# Patient Record
Sex: Female | Born: 1965 | Race: White | Hispanic: No | Marital: Married | Smoking: Never smoker
Health system: Southern US, Community
[De-identification: ages and names within clinical notes are randomized; demographics above are authoritative.]

## PROBLEM LIST (undated history)

## (undated) DIAGNOSIS — M1990 Unspecified osteoarthritis, unspecified site: Secondary | ICD-10-CM

## (undated) DIAGNOSIS — E119 Type 2 diabetes mellitus without complications: Secondary | ICD-10-CM

## (undated) DIAGNOSIS — I10 Essential (primary) hypertension: Secondary | ICD-10-CM

## (undated) DIAGNOSIS — E785 Hyperlipidemia, unspecified: Secondary | ICD-10-CM

## (undated) DIAGNOSIS — F419 Anxiety disorder, unspecified: Secondary | ICD-10-CM

## (undated) DIAGNOSIS — Z5189 Encounter for other specified aftercare: Secondary | ICD-10-CM

## (undated) HISTORY — DX: Type 2 diabetes mellitus without complications: E11.9

## (undated) HISTORY — DX: Encounter for other specified aftercare: Z51.89

## (undated) HISTORY — PX: OTHER SURGICAL HISTORY: SHX169

## (undated) HISTORY — DX: Anxiety disorder, unspecified: F41.9

## (undated) HISTORY — DX: Unspecified osteoarthritis, unspecified site: M19.90

## (undated) HISTORY — DX: Essential (primary) hypertension: I10

## (undated) HISTORY — DX: Hyperlipidemia, unspecified: E78.5

---

## 2013-07-21 HISTORY — PX: COLONOSCOPY: SHX174

## 2014-03-24 DIAGNOSIS — R112 Nausea with vomiting, unspecified: Secondary | ICD-10-CM | POA: Diagnosis not present

## 2014-03-24 DIAGNOSIS — K760 Fatty (change of) liver, not elsewhere classified: Secondary | ICD-10-CM | POA: Diagnosis not present

## 2014-03-24 DIAGNOSIS — N979 Female infertility, unspecified: Secondary | ICD-10-CM | POA: Diagnosis not present

## 2014-03-24 DIAGNOSIS — D251 Intramural leiomyoma of uterus: Secondary | ICD-10-CM | POA: Diagnosis not present

## 2014-03-24 DIAGNOSIS — K828 Other specified diseases of gallbladder: Secondary | ICD-10-CM | POA: Diagnosis not present

## 2014-03-24 DIAGNOSIS — R1011 Right upper quadrant pain: Secondary | ICD-10-CM | POA: Diagnosis not present

## 2014-03-24 DIAGNOSIS — R197 Diarrhea, unspecified: Secondary | ICD-10-CM | POA: Diagnosis not present

## 2014-04-23 DIAGNOSIS — S9032XA Contusion of left foot, initial encounter: Secondary | ICD-10-CM | POA: Diagnosis not present

## 2014-04-23 DIAGNOSIS — E780 Pure hypercholesterolemia: Secondary | ICD-10-CM | POA: Diagnosis not present

## 2014-04-23 DIAGNOSIS — S99922A Unspecified injury of left foot, initial encounter: Secondary | ICD-10-CM | POA: Diagnosis not present

## 2014-04-23 DIAGNOSIS — M79672 Pain in left foot: Secondary | ICD-10-CM | POA: Diagnosis not present

## 2014-10-04 DIAGNOSIS — B9689 Other specified bacterial agents as the cause of diseases classified elsewhere: Secondary | ICD-10-CM | POA: Diagnosis not present

## 2014-10-04 DIAGNOSIS — J208 Acute bronchitis due to other specified organisms: Secondary | ICD-10-CM | POA: Diagnosis not present

## 2014-10-04 DIAGNOSIS — I10 Essential (primary) hypertension: Secondary | ICD-10-CM | POA: Diagnosis not present

## 2014-11-16 DIAGNOSIS — M25552 Pain in left hip: Secondary | ICD-10-CM | POA: Diagnosis not present

## 2014-11-16 DIAGNOSIS — M79642 Pain in left hand: Secondary | ICD-10-CM | POA: Diagnosis not present

## 2014-12-12 DIAGNOSIS — M545 Low back pain: Secondary | ICD-10-CM | POA: Diagnosis not present

## 2014-12-12 DIAGNOSIS — M5416 Radiculopathy, lumbar region: Secondary | ICD-10-CM | POA: Diagnosis not present

## 2014-12-19 DIAGNOSIS — R51 Headache: Secondary | ICD-10-CM | POA: Diagnosis not present

## 2014-12-19 DIAGNOSIS — R109 Unspecified abdominal pain: Secondary | ICD-10-CM | POA: Diagnosis not present

## 2014-12-19 DIAGNOSIS — M25512 Pain in left shoulder: Secondary | ICD-10-CM | POA: Diagnosis not present

## 2014-12-19 DIAGNOSIS — M546 Pain in thoracic spine: Secondary | ICD-10-CM | POA: Diagnosis not present

## 2014-12-19 DIAGNOSIS — S46912A Strain of unspecified muscle, fascia and tendon at shoulder and upper arm level, left arm, initial encounter: Secondary | ICD-10-CM | POA: Diagnosis not present

## 2014-12-19 DIAGNOSIS — M542 Cervicalgia: Secondary | ICD-10-CM | POA: Diagnosis not present

## 2014-12-19 DIAGNOSIS — S301XXA Contusion of abdominal wall, initial encounter: Secondary | ICD-10-CM | POA: Diagnosis not present

## 2014-12-19 DIAGNOSIS — M545 Low back pain: Secondary | ICD-10-CM | POA: Diagnosis not present

## 2014-12-19 DIAGNOSIS — S161XXA Strain of muscle, fascia and tendon at neck level, initial encounter: Secondary | ICD-10-CM | POA: Diagnosis not present

## 2014-12-19 DIAGNOSIS — R0789 Other chest pain: Secondary | ICD-10-CM | POA: Diagnosis not present

## 2014-12-19 DIAGNOSIS — S7002XA Contusion of left hip, initial encounter: Secondary | ICD-10-CM | POA: Diagnosis not present

## 2014-12-19 DIAGNOSIS — R079 Chest pain, unspecified: Secondary | ICD-10-CM | POA: Diagnosis not present

## 2014-12-20 DIAGNOSIS — M62830 Muscle spasm of back: Secondary | ICD-10-CM | POA: Diagnosis not present

## 2014-12-20 DIAGNOSIS — M25519 Pain in unspecified shoulder: Secondary | ICD-10-CM | POA: Diagnosis not present

## 2014-12-20 DIAGNOSIS — S3992XA Unspecified injury of lower back, initial encounter: Secondary | ICD-10-CM | POA: Diagnosis not present

## 2014-12-29 DIAGNOSIS — J0100 Acute maxillary sinusitis, unspecified: Secondary | ICD-10-CM | POA: Diagnosis not present

## 2015-01-19 DIAGNOSIS — R202 Paresthesia of skin: Secondary | ICD-10-CM | POA: Diagnosis not present

## 2015-01-19 DIAGNOSIS — M542 Cervicalgia: Secondary | ICD-10-CM | POA: Diagnosis not present

## 2015-01-27 DIAGNOSIS — Z1231 Encounter for screening mammogram for malignant neoplasm of breast: Secondary | ICD-10-CM | POA: Diagnosis not present

## 2015-03-16 DIAGNOSIS — Z1389 Encounter for screening for other disorder: Secondary | ICD-10-CM | POA: Diagnosis not present

## 2015-03-16 DIAGNOSIS — Z8776 Personal history of (corrected) congenital malformations of integument, limbs and musculoskeletal system: Secondary | ICD-10-CM | POA: Diagnosis not present

## 2015-03-16 DIAGNOSIS — M542 Cervicalgia: Secondary | ICD-10-CM | POA: Diagnosis not present

## 2015-03-16 DIAGNOSIS — G43909 Migraine, unspecified, not intractable, without status migrainosus: Secondary | ICD-10-CM | POA: Diagnosis not present

## 2015-03-16 DIAGNOSIS — G8929 Other chronic pain: Secondary | ICD-10-CM | POA: Diagnosis not present

## 2015-05-04 DIAGNOSIS — R509 Fever, unspecified: Secondary | ICD-10-CM | POA: Diagnosis not present

## 2015-05-04 DIAGNOSIS — J209 Acute bronchitis, unspecified: Secondary | ICD-10-CM | POA: Diagnosis not present

## 2015-05-04 DIAGNOSIS — R05 Cough: Secondary | ICD-10-CM | POA: Diagnosis not present

## 2015-06-05 DIAGNOSIS — R42 Dizziness and giddiness: Secondary | ICD-10-CM | POA: Diagnosis not present

## 2015-06-05 DIAGNOSIS — N938 Other specified abnormal uterine and vaginal bleeding: Secondary | ICD-10-CM | POA: Diagnosis not present

## 2015-06-06 DIAGNOSIS — N912 Amenorrhea, unspecified: Secondary | ICD-10-CM | POA: Diagnosis not present

## 2015-06-06 DIAGNOSIS — Z1389 Encounter for screening for other disorder: Secondary | ICD-10-CM | POA: Diagnosis not present

## 2015-06-06 DIAGNOSIS — R42 Dizziness and giddiness: Secondary | ICD-10-CM | POA: Diagnosis not present

## 2015-07-03 DIAGNOSIS — M79629 Pain in unspecified upper arm: Secondary | ICD-10-CM | POA: Diagnosis not present

## 2015-07-03 DIAGNOSIS — J209 Acute bronchitis, unspecified: Secondary | ICD-10-CM | POA: Diagnosis not present

## 2015-07-11 DIAGNOSIS — M5412 Radiculopathy, cervical region: Secondary | ICD-10-CM | POA: Diagnosis not present

## 2015-07-11 DIAGNOSIS — M47812 Spondylosis without myelopathy or radiculopathy, cervical region: Secondary | ICD-10-CM | POA: Diagnosis not present

## 2015-07-11 DIAGNOSIS — Z79891 Long term (current) use of opiate analgesic: Secondary | ICD-10-CM | POA: Diagnosis not present

## 2015-07-11 DIAGNOSIS — G894 Chronic pain syndrome: Secondary | ICD-10-CM | POA: Diagnosis not present

## 2015-07-11 DIAGNOSIS — S143XXS Injury of brachial plexus, sequela: Secondary | ICD-10-CM | POA: Diagnosis not present

## 2015-07-18 DIAGNOSIS — M542 Cervicalgia: Secondary | ICD-10-CM | POA: Diagnosis not present

## 2015-07-18 DIAGNOSIS — S199XXA Unspecified injury of neck, initial encounter: Secondary | ICD-10-CM | POA: Diagnosis not present

## 2015-07-18 DIAGNOSIS — M5021 Other cervical disc displacement,  high cervical region: Secondary | ICD-10-CM | POA: Diagnosis not present

## 2015-08-08 DIAGNOSIS — M5412 Radiculopathy, cervical region: Secondary | ICD-10-CM | POA: Diagnosis not present

## 2015-08-08 DIAGNOSIS — S143XXS Injury of brachial plexus, sequela: Secondary | ICD-10-CM | POA: Diagnosis not present

## 2015-08-08 DIAGNOSIS — G5601 Carpal tunnel syndrome, right upper limb: Secondary | ICD-10-CM | POA: Diagnosis not present

## 2015-08-09 DIAGNOSIS — Q731 Phocomelia, unspecified limb(s): Secondary | ICD-10-CM | POA: Diagnosis not present

## 2015-08-09 DIAGNOSIS — G894 Chronic pain syndrome: Secondary | ICD-10-CM | POA: Diagnosis not present

## 2015-08-09 DIAGNOSIS — M47812 Spondylosis without myelopathy or radiculopathy, cervical region: Secondary | ICD-10-CM | POA: Diagnosis not present

## 2015-08-09 DIAGNOSIS — M5412 Radiculopathy, cervical region: Secondary | ICD-10-CM | POA: Diagnosis not present

## 2015-09-22 DIAGNOSIS — R1011 Right upper quadrant pain: Secondary | ICD-10-CM | POA: Diagnosis not present

## 2015-09-22 DIAGNOSIS — K219 Gastro-esophageal reflux disease without esophagitis: Secondary | ICD-10-CM | POA: Diagnosis not present

## 2015-09-22 DIAGNOSIS — N3000 Acute cystitis without hematuria: Secondary | ICD-10-CM | POA: Diagnosis not present

## 2015-09-28 DIAGNOSIS — R1011 Right upper quadrant pain: Secondary | ICD-10-CM | POA: Diagnosis not present

## 2015-10-09 DIAGNOSIS — G894 Chronic pain syndrome: Secondary | ICD-10-CM | POA: Diagnosis not present

## 2015-10-09 DIAGNOSIS — Q731 Phocomelia, unspecified limb(s): Secondary | ICD-10-CM | POA: Diagnosis not present

## 2015-10-09 DIAGNOSIS — M5412 Radiculopathy, cervical region: Secondary | ICD-10-CM | POA: Diagnosis not present

## 2015-10-09 DIAGNOSIS — M47812 Spondylosis without myelopathy or radiculopathy, cervical region: Secondary | ICD-10-CM | POA: Diagnosis not present

## 2015-11-06 DIAGNOSIS — G894 Chronic pain syndrome: Secondary | ICD-10-CM | POA: Diagnosis not present

## 2015-11-06 DIAGNOSIS — Q731 Phocomelia, unspecified limb(s): Secondary | ICD-10-CM | POA: Diagnosis not present

## 2015-11-06 DIAGNOSIS — M47812 Spondylosis without myelopathy or radiculopathy, cervical region: Secondary | ICD-10-CM | POA: Diagnosis not present

## 2015-11-06 DIAGNOSIS — M5412 Radiculopathy, cervical region: Secondary | ICD-10-CM | POA: Diagnosis not present

## 2015-11-22 DIAGNOSIS — S93402A Sprain of unspecified ligament of left ankle, initial encounter: Secondary | ICD-10-CM | POA: Diagnosis not present

## 2015-11-22 DIAGNOSIS — Q72893 Other reduction defects of lower limb, bilateral: Secondary | ICD-10-CM | POA: Diagnosis not present

## 2015-11-22 DIAGNOSIS — Z23 Encounter for immunization: Secondary | ICD-10-CM | POA: Diagnosis not present

## 2015-11-22 DIAGNOSIS — Z8776 Personal history of (corrected) congenital malformations of integument, limbs and musculoskeletal system: Secondary | ICD-10-CM | POA: Diagnosis not present

## 2015-12-06 DIAGNOSIS — G894 Chronic pain syndrome: Secondary | ICD-10-CM | POA: Diagnosis not present

## 2015-12-06 DIAGNOSIS — Q731 Phocomelia, unspecified limb(s): Secondary | ICD-10-CM | POA: Diagnosis not present

## 2015-12-06 DIAGNOSIS — M47812 Spondylosis without myelopathy or radiculopathy, cervical region: Secondary | ICD-10-CM | POA: Diagnosis not present

## 2015-12-06 DIAGNOSIS — M5412 Radiculopathy, cervical region: Secondary | ICD-10-CM | POA: Diagnosis not present

## 2016-01-03 DIAGNOSIS — Z79891 Long term (current) use of opiate analgesic: Secondary | ICD-10-CM | POA: Diagnosis not present

## 2016-01-03 DIAGNOSIS — Q731 Phocomelia, unspecified limb(s): Secondary | ICD-10-CM | POA: Diagnosis not present

## 2016-01-03 DIAGNOSIS — M47812 Spondylosis without myelopathy or radiculopathy, cervical region: Secondary | ICD-10-CM | POA: Diagnosis not present

## 2016-01-03 DIAGNOSIS — G4700 Insomnia, unspecified: Secondary | ICD-10-CM | POA: Diagnosis not present

## 2016-01-03 DIAGNOSIS — G894 Chronic pain syndrome: Secondary | ICD-10-CM | POA: Diagnosis not present

## 2016-01-03 DIAGNOSIS — M5412 Radiculopathy, cervical region: Secondary | ICD-10-CM | POA: Diagnosis not present

## 2016-01-31 DIAGNOSIS — M5412 Radiculopathy, cervical region: Secondary | ICD-10-CM | POA: Diagnosis not present

## 2016-01-31 DIAGNOSIS — M47812 Spondylosis without myelopathy or radiculopathy, cervical region: Secondary | ICD-10-CM | POA: Diagnosis not present

## 2016-01-31 DIAGNOSIS — Q731 Phocomelia, unspecified limb(s): Secondary | ICD-10-CM | POA: Diagnosis not present

## 2016-01-31 DIAGNOSIS — G894 Chronic pain syndrome: Secondary | ICD-10-CM | POA: Diagnosis not present

## 2016-02-26 DIAGNOSIS — R05 Cough: Secondary | ICD-10-CM | POA: Diagnosis not present

## 2016-02-26 DIAGNOSIS — R112 Nausea with vomiting, unspecified: Secondary | ICD-10-CM | POA: Diagnosis not present

## 2016-02-26 DIAGNOSIS — B349 Viral infection, unspecified: Secondary | ICD-10-CM | POA: Diagnosis not present

## 2016-02-26 DIAGNOSIS — R109 Unspecified abdominal pain: Secondary | ICD-10-CM | POA: Diagnosis not present

## 2016-03-29 DIAGNOSIS — M47812 Spondylosis without myelopathy or radiculopathy, cervical region: Secondary | ICD-10-CM | POA: Diagnosis not present

## 2016-03-29 DIAGNOSIS — M5412 Radiculopathy, cervical region: Secondary | ICD-10-CM | POA: Diagnosis not present

## 2016-03-29 DIAGNOSIS — Q731 Phocomelia, unspecified limb(s): Secondary | ICD-10-CM | POA: Diagnosis not present

## 2016-03-29 DIAGNOSIS — G894 Chronic pain syndrome: Secondary | ICD-10-CM | POA: Diagnosis not present

## 2016-04-26 DIAGNOSIS — M47812 Spondylosis without myelopathy or radiculopathy, cervical region: Secondary | ICD-10-CM | POA: Diagnosis not present

## 2016-04-26 DIAGNOSIS — G894 Chronic pain syndrome: Secondary | ICD-10-CM | POA: Diagnosis not present

## 2016-04-26 DIAGNOSIS — Q731 Phocomelia, unspecified limb(s): Secondary | ICD-10-CM | POA: Diagnosis not present

## 2016-04-26 DIAGNOSIS — M5412 Radiculopathy, cervical region: Secondary | ICD-10-CM | POA: Diagnosis not present

## 2016-05-24 DIAGNOSIS — M5412 Radiculopathy, cervical region: Secondary | ICD-10-CM | POA: Diagnosis not present

## 2016-05-24 DIAGNOSIS — Q731 Phocomelia, unspecified limb(s): Secondary | ICD-10-CM | POA: Diagnosis not present

## 2016-05-24 DIAGNOSIS — M47812 Spondylosis without myelopathy or radiculopathy, cervical region: Secondary | ICD-10-CM | POA: Diagnosis not present

## 2016-05-24 DIAGNOSIS — G894 Chronic pain syndrome: Secondary | ICD-10-CM | POA: Diagnosis not present

## 2016-06-21 DIAGNOSIS — M5412 Radiculopathy, cervical region: Secondary | ICD-10-CM | POA: Diagnosis not present

## 2016-06-21 DIAGNOSIS — M47812 Spondylosis without myelopathy or radiculopathy, cervical region: Secondary | ICD-10-CM | POA: Diagnosis not present

## 2016-06-21 DIAGNOSIS — Q731 Phocomelia, unspecified limb(s): Secondary | ICD-10-CM | POA: Diagnosis not present

## 2016-06-21 DIAGNOSIS — G894 Chronic pain syndrome: Secondary | ICD-10-CM | POA: Diagnosis not present

## 2016-07-19 DIAGNOSIS — Q731 Phocomelia, unspecified limb(s): Secondary | ICD-10-CM | POA: Diagnosis not present

## 2016-07-19 DIAGNOSIS — G894 Chronic pain syndrome: Secondary | ICD-10-CM | POA: Diagnosis not present

## 2016-07-19 DIAGNOSIS — M47812 Spondylosis without myelopathy or radiculopathy, cervical region: Secondary | ICD-10-CM | POA: Diagnosis not present

## 2016-07-19 DIAGNOSIS — M5412 Radiculopathy, cervical region: Secondary | ICD-10-CM | POA: Diagnosis not present

## 2016-08-19 DIAGNOSIS — M47812 Spondylosis without myelopathy or radiculopathy, cervical region: Secondary | ICD-10-CM | POA: Diagnosis not present

## 2016-08-19 DIAGNOSIS — Q731 Phocomelia, unspecified limb(s): Secondary | ICD-10-CM | POA: Diagnosis not present

## 2016-08-19 DIAGNOSIS — M5412 Radiculopathy, cervical region: Secondary | ICD-10-CM | POA: Diagnosis not present

## 2016-08-19 DIAGNOSIS — G894 Chronic pain syndrome: Secondary | ICD-10-CM | POA: Diagnosis not present

## 2016-09-04 DIAGNOSIS — M6281 Muscle weakness (generalized): Secondary | ICD-10-CM | POA: Diagnosis not present

## 2016-09-04 DIAGNOSIS — M25511 Pain in right shoulder: Secondary | ICD-10-CM | POA: Diagnosis not present

## 2016-09-04 DIAGNOSIS — M7551 Bursitis of right shoulder: Secondary | ICD-10-CM | POA: Diagnosis not present

## 2016-09-04 DIAGNOSIS — M25611 Stiffness of right shoulder, not elsewhere classified: Secondary | ICD-10-CM | POA: Diagnosis not present

## 2016-10-08 DIAGNOSIS — G894 Chronic pain syndrome: Secondary | ICD-10-CM | POA: Diagnosis not present

## 2016-10-08 DIAGNOSIS — I10 Essential (primary) hypertension: Secondary | ICD-10-CM | POA: Diagnosis not present

## 2016-10-11 DIAGNOSIS — M5412 Radiculopathy, cervical region: Secondary | ICD-10-CM | POA: Diagnosis not present

## 2016-10-11 DIAGNOSIS — Q731 Phocomelia, unspecified limb(s): Secondary | ICD-10-CM | POA: Diagnosis not present

## 2016-10-11 DIAGNOSIS — M47812 Spondylosis without myelopathy or radiculopathy, cervical region: Secondary | ICD-10-CM | POA: Diagnosis not present

## 2016-10-11 DIAGNOSIS — G894 Chronic pain syndrome: Secondary | ICD-10-CM | POA: Diagnosis not present

## 2016-11-07 DIAGNOSIS — Z139 Encounter for screening, unspecified: Secondary | ICD-10-CM | POA: Diagnosis not present

## 2016-11-07 DIAGNOSIS — Z7189 Other specified counseling: Secondary | ICD-10-CM | POA: Diagnosis not present

## 2016-11-07 DIAGNOSIS — I10 Essential (primary) hypertension: Secondary | ICD-10-CM | POA: Diagnosis not present

## 2016-11-15 DIAGNOSIS — M47812 Spondylosis without myelopathy or radiculopathy, cervical region: Secondary | ICD-10-CM | POA: Diagnosis not present

## 2016-11-15 DIAGNOSIS — G894 Chronic pain syndrome: Secondary | ICD-10-CM | POA: Diagnosis not present

## 2016-11-15 DIAGNOSIS — M5412 Radiculopathy, cervical region: Secondary | ICD-10-CM | POA: Diagnosis not present

## 2016-11-15 DIAGNOSIS — Q731 Phocomelia, unspecified limb(s): Secondary | ICD-10-CM | POA: Diagnosis not present

## 2016-11-25 DIAGNOSIS — J029 Acute pharyngitis, unspecified: Secondary | ICD-10-CM | POA: Diagnosis not present

## 2016-11-25 DIAGNOSIS — R05 Cough: Secondary | ICD-10-CM | POA: Diagnosis not present

## 2016-11-25 DIAGNOSIS — K529 Noninfective gastroenteritis and colitis, unspecified: Secondary | ICD-10-CM | POA: Diagnosis not present

## 2016-12-13 DIAGNOSIS — Z79891 Long term (current) use of opiate analgesic: Secondary | ICD-10-CM | POA: Diagnosis not present

## 2016-12-13 DIAGNOSIS — M5412 Radiculopathy, cervical region: Secondary | ICD-10-CM | POA: Diagnosis not present

## 2016-12-13 DIAGNOSIS — M62830 Muscle spasm of back: Secondary | ICD-10-CM | POA: Diagnosis not present

## 2016-12-13 DIAGNOSIS — M7551 Bursitis of right shoulder: Secondary | ICD-10-CM | POA: Diagnosis not present

## 2016-12-13 DIAGNOSIS — M47812 Spondylosis without myelopathy or radiculopathy, cervical region: Secondary | ICD-10-CM | POA: Diagnosis not present

## 2016-12-13 DIAGNOSIS — G5601 Carpal tunnel syndrome, right upper limb: Secondary | ICD-10-CM | POA: Diagnosis not present

## 2016-12-13 DIAGNOSIS — Q731 Phocomelia, unspecified limb(s): Secondary | ICD-10-CM | POA: Diagnosis not present

## 2016-12-13 DIAGNOSIS — G894 Chronic pain syndrome: Secondary | ICD-10-CM | POA: Diagnosis not present

## 2017-01-10 DIAGNOSIS — G894 Chronic pain syndrome: Secondary | ICD-10-CM | POA: Diagnosis not present

## 2017-01-10 DIAGNOSIS — Q731 Phocomelia, unspecified limb(s): Secondary | ICD-10-CM | POA: Diagnosis not present

## 2017-01-10 DIAGNOSIS — M47812 Spondylosis without myelopathy or radiculopathy, cervical region: Secondary | ICD-10-CM | POA: Diagnosis not present

## 2017-01-10 DIAGNOSIS — M5412 Radiculopathy, cervical region: Secondary | ICD-10-CM | POA: Diagnosis not present

## 2017-01-28 DIAGNOSIS — Z1329 Encounter for screening for other suspected endocrine disorder: Secondary | ICD-10-CM | POA: Diagnosis not present

## 2017-01-28 DIAGNOSIS — Z23 Encounter for immunization: Secondary | ICD-10-CM | POA: Diagnosis not present

## 2017-01-28 DIAGNOSIS — I10 Essential (primary) hypertension: Secondary | ICD-10-CM | POA: Diagnosis not present

## 2017-02-07 DIAGNOSIS — Q731 Phocomelia, unspecified limb(s): Secondary | ICD-10-CM | POA: Diagnosis not present

## 2017-02-07 DIAGNOSIS — G894 Chronic pain syndrome: Secondary | ICD-10-CM | POA: Diagnosis not present

## 2017-02-07 DIAGNOSIS — M47812 Spondylosis without myelopathy or radiculopathy, cervical region: Secondary | ICD-10-CM | POA: Diagnosis not present

## 2017-02-07 DIAGNOSIS — M5412 Radiculopathy, cervical region: Secondary | ICD-10-CM | POA: Diagnosis not present

## 2017-02-10 DIAGNOSIS — R1011 Right upper quadrant pain: Secondary | ICD-10-CM | POA: Diagnosis not present

## 2017-02-10 DIAGNOSIS — I10 Essential (primary) hypertension: Secondary | ICD-10-CM | POA: Diagnosis not present

## 2017-02-14 DIAGNOSIS — R1011 Right upper quadrant pain: Secondary | ICD-10-CM | POA: Diagnosis not present

## 2017-02-14 DIAGNOSIS — K760 Fatty (change of) liver, not elsewhere classified: Secondary | ICD-10-CM | POA: Diagnosis not present

## 2017-03-05 DIAGNOSIS — Q731 Phocomelia, unspecified limb(s): Secondary | ICD-10-CM | POA: Diagnosis not present

## 2017-03-05 DIAGNOSIS — G894 Chronic pain syndrome: Secondary | ICD-10-CM | POA: Diagnosis not present

## 2017-03-05 DIAGNOSIS — M5412 Radiculopathy, cervical region: Secondary | ICD-10-CM | POA: Diagnosis not present

## 2017-03-05 DIAGNOSIS — M47812 Spondylosis without myelopathy or radiculopathy, cervical region: Secondary | ICD-10-CM | POA: Diagnosis not present

## 2017-03-17 DIAGNOSIS — Z780 Asymptomatic menopausal state: Secondary | ICD-10-CM | POA: Diagnosis not present

## 2017-04-02 DIAGNOSIS — M5412 Radiculopathy, cervical region: Secondary | ICD-10-CM | POA: Diagnosis not present

## 2017-04-02 DIAGNOSIS — Q731 Phocomelia, unspecified limb(s): Secondary | ICD-10-CM | POA: Diagnosis not present

## 2017-04-02 DIAGNOSIS — G894 Chronic pain syndrome: Secondary | ICD-10-CM | POA: Diagnosis not present

## 2017-04-02 DIAGNOSIS — M47812 Spondylosis without myelopathy or radiculopathy, cervical region: Secondary | ICD-10-CM | POA: Diagnosis not present

## 2017-04-25 DIAGNOSIS — S46811A Strain of other muscles, fascia and tendons at shoulder and upper arm level, right arm, initial encounter: Secondary | ICD-10-CM | POA: Diagnosis not present

## 2017-04-30 DIAGNOSIS — M5412 Radiculopathy, cervical region: Secondary | ICD-10-CM | POA: Diagnosis not present

## 2017-04-30 DIAGNOSIS — M47812 Spondylosis without myelopathy or radiculopathy, cervical region: Secondary | ICD-10-CM | POA: Diagnosis not present

## 2017-04-30 DIAGNOSIS — G894 Chronic pain syndrome: Secondary | ICD-10-CM | POA: Diagnosis not present

## 2017-04-30 DIAGNOSIS — Q731 Phocomelia, unspecified limb(s): Secondary | ICD-10-CM | POA: Diagnosis not present

## 2017-05-29 DIAGNOSIS — M5412 Radiculopathy, cervical region: Secondary | ICD-10-CM | POA: Diagnosis not present

## 2017-05-29 DIAGNOSIS — Q731 Phocomelia, unspecified limb(s): Secondary | ICD-10-CM | POA: Diagnosis not present

## 2017-05-29 DIAGNOSIS — G894 Chronic pain syndrome: Secondary | ICD-10-CM | POA: Diagnosis not present

## 2017-05-29 DIAGNOSIS — M47812 Spondylosis without myelopathy or radiculopathy, cervical region: Secondary | ICD-10-CM | POA: Diagnosis not present

## 2017-06-06 DIAGNOSIS — M25551 Pain in right hip: Secondary | ICD-10-CM | POA: Diagnosis not present

## 2017-06-06 DIAGNOSIS — S7001XA Contusion of right hip, initial encounter: Secondary | ICD-10-CM | POA: Diagnosis not present

## 2017-06-06 DIAGNOSIS — S7002XA Contusion of left hip, initial encounter: Secondary | ICD-10-CM | POA: Diagnosis not present

## 2017-06-12 DIAGNOSIS — I10 Essential (primary) hypertension: Secondary | ICD-10-CM | POA: Diagnosis not present

## 2017-06-12 DIAGNOSIS — G894 Chronic pain syndrome: Secondary | ICD-10-CM | POA: Diagnosis not present

## 2017-06-19 DIAGNOSIS — G894 Chronic pain syndrome: Secondary | ICD-10-CM | POA: Diagnosis not present

## 2017-06-19 DIAGNOSIS — I10 Essential (primary) hypertension: Secondary | ICD-10-CM | POA: Diagnosis not present

## 2017-06-23 DIAGNOSIS — Q742 Other congenital malformations of lower limb(s), including pelvic girdle: Secondary | ICD-10-CM | POA: Diagnosis not present

## 2017-06-23 DIAGNOSIS — Z139 Encounter for screening, unspecified: Secondary | ICD-10-CM | POA: Diagnosis not present

## 2017-06-23 DIAGNOSIS — Z0000 Encounter for general adult medical examination without abnormal findings: Secondary | ICD-10-CM | POA: Diagnosis not present

## 2017-06-23 DIAGNOSIS — Z9181 History of falling: Secondary | ICD-10-CM | POA: Diagnosis not present

## 2017-06-26 DIAGNOSIS — G894 Chronic pain syndrome: Secondary | ICD-10-CM | POA: Diagnosis not present

## 2017-06-26 DIAGNOSIS — M47812 Spondylosis without myelopathy or radiculopathy, cervical region: Secondary | ICD-10-CM | POA: Diagnosis not present

## 2017-06-26 DIAGNOSIS — Q731 Phocomelia, unspecified limb(s): Secondary | ICD-10-CM | POA: Diagnosis not present

## 2017-06-26 DIAGNOSIS — M5412 Radiculopathy, cervical region: Secondary | ICD-10-CM | POA: Diagnosis not present

## 2017-07-03 DIAGNOSIS — I10 Essential (primary) hypertension: Secondary | ICD-10-CM | POA: Diagnosis not present

## 2017-07-11 DIAGNOSIS — Q7243 Longitudinal reduction defect of femur, bilateral: Secondary | ICD-10-CM | POA: Diagnosis not present

## 2017-07-11 DIAGNOSIS — M25552 Pain in left hip: Secondary | ICD-10-CM | POA: Diagnosis not present

## 2017-07-11 DIAGNOSIS — M25561 Pain in right knee: Secondary | ICD-10-CM | POA: Diagnosis not present

## 2017-07-11 DIAGNOSIS — M25562 Pain in left knee: Secondary | ICD-10-CM | POA: Diagnosis not present

## 2017-07-11 DIAGNOSIS — M25551 Pain in right hip: Secondary | ICD-10-CM | POA: Diagnosis not present

## 2017-07-11 DIAGNOSIS — Q6589 Other specified congenital deformities of hip: Secondary | ICD-10-CM | POA: Diagnosis not present

## 2017-07-17 DIAGNOSIS — I10 Essential (primary) hypertension: Secondary | ICD-10-CM | POA: Diagnosis not present

## 2017-07-25 DIAGNOSIS — M5412 Radiculopathy, cervical region: Secondary | ICD-10-CM | POA: Diagnosis not present

## 2017-07-25 DIAGNOSIS — M47812 Spondylosis without myelopathy or radiculopathy, cervical region: Secondary | ICD-10-CM | POA: Diagnosis not present

## 2017-07-25 DIAGNOSIS — Q731 Phocomelia, unspecified limb(s): Secondary | ICD-10-CM | POA: Diagnosis not present

## 2017-07-25 DIAGNOSIS — G894 Chronic pain syndrome: Secondary | ICD-10-CM | POA: Diagnosis not present

## 2017-07-28 DIAGNOSIS — G43909 Migraine, unspecified, not intractable, without status migrainosus: Secondary | ICD-10-CM | POA: Diagnosis not present

## 2017-07-28 DIAGNOSIS — I10 Essential (primary) hypertension: Secondary | ICD-10-CM | POA: Diagnosis not present

## 2017-08-14 DIAGNOSIS — I10 Essential (primary) hypertension: Secondary | ICD-10-CM | POA: Diagnosis not present

## 2017-08-15 DIAGNOSIS — Q7243 Longitudinal reduction defect of femur, bilateral: Secondary | ICD-10-CM | POA: Diagnosis not present

## 2017-08-22 DIAGNOSIS — M5412 Radiculopathy, cervical region: Secondary | ICD-10-CM | POA: Diagnosis not present

## 2017-08-22 DIAGNOSIS — G4700 Insomnia, unspecified: Secondary | ICD-10-CM | POA: Diagnosis not present

## 2017-08-22 DIAGNOSIS — M47812 Spondylosis without myelopathy or radiculopathy, cervical region: Secondary | ICD-10-CM | POA: Diagnosis not present

## 2017-08-22 DIAGNOSIS — G894 Chronic pain syndrome: Secondary | ICD-10-CM | POA: Diagnosis not present

## 2017-08-28 DIAGNOSIS — I10 Essential (primary) hypertension: Secondary | ICD-10-CM | POA: Diagnosis not present

## 2017-09-05 DIAGNOSIS — I10 Essential (primary) hypertension: Secondary | ICD-10-CM | POA: Diagnosis not present

## 2017-09-05 DIAGNOSIS — Z1231 Encounter for screening mammogram for malignant neoplasm of breast: Secondary | ICD-10-CM | POA: Diagnosis not present

## 2017-09-07 DIAGNOSIS — I10 Essential (primary) hypertension: Secondary | ICD-10-CM | POA: Diagnosis not present

## 2017-09-18 DIAGNOSIS — I10 Essential (primary) hypertension: Secondary | ICD-10-CM | POA: Diagnosis not present

## 2017-09-19 DIAGNOSIS — M5412 Radiculopathy, cervical region: Secondary | ICD-10-CM | POA: Diagnosis not present

## 2017-09-19 DIAGNOSIS — G894 Chronic pain syndrome: Secondary | ICD-10-CM | POA: Diagnosis not present

## 2017-09-19 DIAGNOSIS — G4700 Insomnia, unspecified: Secondary | ICD-10-CM | POA: Diagnosis not present

## 2017-09-19 DIAGNOSIS — M47812 Spondylosis without myelopathy or radiculopathy, cervical region: Secondary | ICD-10-CM | POA: Diagnosis not present

## 2017-10-02 DIAGNOSIS — I10 Essential (primary) hypertension: Secondary | ICD-10-CM | POA: Diagnosis not present

## 2017-10-03 DIAGNOSIS — Z7189 Other specified counseling: Secondary | ICD-10-CM | POA: Diagnosis not present

## 2017-10-03 DIAGNOSIS — R110 Nausea: Secondary | ICD-10-CM | POA: Diagnosis not present

## 2017-10-08 DIAGNOSIS — I10 Essential (primary) hypertension: Secondary | ICD-10-CM | POA: Diagnosis not present

## 2017-10-10 DIAGNOSIS — Z1231 Encounter for screening mammogram for malignant neoplasm of breast: Secondary | ICD-10-CM | POA: Diagnosis not present

## 2017-10-20 DIAGNOSIS — G4700 Insomnia, unspecified: Secondary | ICD-10-CM | POA: Diagnosis not present

## 2017-10-20 DIAGNOSIS — M47812 Spondylosis without myelopathy or radiculopathy, cervical region: Secondary | ICD-10-CM | POA: Diagnosis not present

## 2017-10-20 DIAGNOSIS — M5412 Radiculopathy, cervical region: Secondary | ICD-10-CM | POA: Diagnosis not present

## 2017-10-20 DIAGNOSIS — G894 Chronic pain syndrome: Secondary | ICD-10-CM | POA: Diagnosis not present

## 2017-10-22 DIAGNOSIS — R928 Other abnormal and inconclusive findings on diagnostic imaging of breast: Secondary | ICD-10-CM | POA: Diagnosis not present

## 2017-10-22 DIAGNOSIS — K5900 Constipation, unspecified: Secondary | ICD-10-CM | POA: Diagnosis not present

## 2017-11-05 DIAGNOSIS — G43909 Migraine, unspecified, not intractable, without status migrainosus: Secondary | ICD-10-CM | POA: Diagnosis not present

## 2017-11-05 DIAGNOSIS — R079 Chest pain, unspecified: Secondary | ICD-10-CM | POA: Diagnosis not present

## 2017-11-05 DIAGNOSIS — R072 Precordial pain: Secondary | ICD-10-CM | POA: Diagnosis not present

## 2017-11-05 DIAGNOSIS — J9811 Atelectasis: Secondary | ICD-10-CM | POA: Diagnosis not present

## 2017-11-05 DIAGNOSIS — E7800 Pure hypercholesterolemia, unspecified: Secondary | ICD-10-CM | POA: Diagnosis not present

## 2017-11-05 DIAGNOSIS — M791 Myalgia, unspecified site: Secondary | ICD-10-CM | POA: Diagnosis not present

## 2017-11-05 DIAGNOSIS — R0789 Other chest pain: Secondary | ICD-10-CM | POA: Diagnosis not present

## 2017-11-05 DIAGNOSIS — I10 Essential (primary) hypertension: Secondary | ICD-10-CM | POA: Diagnosis not present

## 2017-11-07 DIAGNOSIS — I10 Essential (primary) hypertension: Secondary | ICD-10-CM | POA: Diagnosis not present

## 2017-11-14 DIAGNOSIS — Z23 Encounter for immunization: Secondary | ICD-10-CM | POA: Diagnosis not present

## 2017-11-14 DIAGNOSIS — R0789 Other chest pain: Secondary | ICD-10-CM | POA: Diagnosis not present

## 2017-11-19 DIAGNOSIS — Z79891 Long term (current) use of opiate analgesic: Secondary | ICD-10-CM | POA: Diagnosis not present

## 2017-11-19 DIAGNOSIS — M47812 Spondylosis without myelopathy or radiculopathy, cervical region: Secondary | ICD-10-CM | POA: Diagnosis not present

## 2017-11-19 DIAGNOSIS — M5412 Radiculopathy, cervical region: Secondary | ICD-10-CM | POA: Diagnosis not present

## 2017-11-19 DIAGNOSIS — G894 Chronic pain syndrome: Secondary | ICD-10-CM | POA: Diagnosis not present

## 2017-11-19 DIAGNOSIS — G4700 Insomnia, unspecified: Secondary | ICD-10-CM | POA: Diagnosis not present

## 2017-11-26 DIAGNOSIS — J0100 Acute maxillary sinusitis, unspecified: Secondary | ICD-10-CM | POA: Diagnosis not present

## 2017-12-01 DIAGNOSIS — Z0000 Encounter for general adult medical examination without abnormal findings: Secondary | ICD-10-CM | POA: Diagnosis not present

## 2017-12-01 DIAGNOSIS — Z131 Encounter for screening for diabetes mellitus: Secondary | ICD-10-CM | POA: Diagnosis not present

## 2017-12-01 DIAGNOSIS — E785 Hyperlipidemia, unspecified: Secondary | ICD-10-CM | POA: Diagnosis not present

## 2017-12-08 DIAGNOSIS — I10 Essential (primary) hypertension: Secondary | ICD-10-CM | POA: Diagnosis not present

## 2017-12-08 DIAGNOSIS — E785 Hyperlipidemia, unspecified: Secondary | ICD-10-CM | POA: Diagnosis not present

## 2017-12-17 DIAGNOSIS — G894 Chronic pain syndrome: Secondary | ICD-10-CM | POA: Diagnosis not present

## 2017-12-17 DIAGNOSIS — G4700 Insomnia, unspecified: Secondary | ICD-10-CM | POA: Diagnosis not present

## 2017-12-17 DIAGNOSIS — M47812 Spondylosis without myelopathy or radiculopathy, cervical region: Secondary | ICD-10-CM | POA: Diagnosis not present

## 2017-12-17 DIAGNOSIS — M5412 Radiculopathy, cervical region: Secondary | ICD-10-CM | POA: Diagnosis not present

## 2017-12-18 DIAGNOSIS — E638 Other specified nutritional deficiencies: Secondary | ICD-10-CM | POA: Diagnosis not present

## 2017-12-18 DIAGNOSIS — G894 Chronic pain syndrome: Secondary | ICD-10-CM | POA: Diagnosis not present

## 2017-12-29 DIAGNOSIS — K210 Gastro-esophageal reflux disease with esophagitis: Secondary | ICD-10-CM | POA: Diagnosis not present

## 2017-12-29 DIAGNOSIS — E785 Hyperlipidemia, unspecified: Secondary | ICD-10-CM | POA: Diagnosis not present

## 2018-01-08 DIAGNOSIS — K210 Gastro-esophageal reflux disease with esophagitis: Secondary | ICD-10-CM | POA: Diagnosis not present

## 2018-01-08 DIAGNOSIS — E785 Hyperlipidemia, unspecified: Secondary | ICD-10-CM | POA: Diagnosis not present

## 2018-01-16 DIAGNOSIS — G4700 Insomnia, unspecified: Secondary | ICD-10-CM | POA: Diagnosis not present

## 2018-01-16 DIAGNOSIS — G894 Chronic pain syndrome: Secondary | ICD-10-CM | POA: Diagnosis not present

## 2018-01-16 DIAGNOSIS — M5412 Radiculopathy, cervical region: Secondary | ICD-10-CM | POA: Diagnosis not present

## 2018-01-16 DIAGNOSIS — M47812 Spondylosis without myelopathy or radiculopathy, cervical region: Secondary | ICD-10-CM | POA: Diagnosis not present

## 2018-01-22 DIAGNOSIS — G894 Chronic pain syndrome: Secondary | ICD-10-CM | POA: Diagnosis not present

## 2018-01-22 DIAGNOSIS — E638 Other specified nutritional deficiencies: Secondary | ICD-10-CM | POA: Diagnosis not present

## 2018-02-07 DIAGNOSIS — I10 Essential (primary) hypertension: Secondary | ICD-10-CM | POA: Diagnosis not present

## 2018-02-07 DIAGNOSIS — E785 Hyperlipidemia, unspecified: Secondary | ICD-10-CM | POA: Diagnosis not present

## 2018-02-07 DIAGNOSIS — K210 Gastro-esophageal reflux disease with esophagitis: Secondary | ICD-10-CM | POA: Diagnosis not present

## 2018-02-19 DIAGNOSIS — G894 Chronic pain syndrome: Secondary | ICD-10-CM | POA: Diagnosis not present

## 2018-02-19 DIAGNOSIS — M5412 Radiculopathy, cervical region: Secondary | ICD-10-CM | POA: Diagnosis not present

## 2018-02-19 DIAGNOSIS — M47812 Spondylosis without myelopathy or radiculopathy, cervical region: Secondary | ICD-10-CM | POA: Diagnosis not present

## 2018-02-19 DIAGNOSIS — G4700 Insomnia, unspecified: Secondary | ICD-10-CM | POA: Diagnosis not present

## 2018-02-20 DIAGNOSIS — E638 Other specified nutritional deficiencies: Secondary | ICD-10-CM | POA: Diagnosis not present

## 2018-02-20 DIAGNOSIS — G894 Chronic pain syndrome: Secondary | ICD-10-CM | POA: Diagnosis not present

## 2018-03-10 DIAGNOSIS — K210 Gastro-esophageal reflux disease with esophagitis: Secondary | ICD-10-CM | POA: Diagnosis not present

## 2018-03-10 DIAGNOSIS — E785 Hyperlipidemia, unspecified: Secondary | ICD-10-CM | POA: Diagnosis not present

## 2018-03-10 DIAGNOSIS — I10 Essential (primary) hypertension: Secondary | ICD-10-CM | POA: Diagnosis not present

## 2018-03-19 DIAGNOSIS — G4700 Insomnia, unspecified: Secondary | ICD-10-CM | POA: Diagnosis not present

## 2018-03-19 DIAGNOSIS — G894 Chronic pain syndrome: Secondary | ICD-10-CM | POA: Diagnosis not present

## 2018-03-19 DIAGNOSIS — M47812 Spondylosis without myelopathy or radiculopathy, cervical region: Secondary | ICD-10-CM | POA: Diagnosis not present

## 2018-03-19 DIAGNOSIS — M5412 Radiculopathy, cervical region: Secondary | ICD-10-CM | POA: Diagnosis not present

## 2018-03-25 DIAGNOSIS — E785 Hyperlipidemia, unspecified: Secondary | ICD-10-CM | POA: Diagnosis not present

## 2018-03-25 DIAGNOSIS — I10 Essential (primary) hypertension: Secondary | ICD-10-CM | POA: Diagnosis not present

## 2018-04-01 DIAGNOSIS — E785 Hyperlipidemia, unspecified: Secondary | ICD-10-CM | POA: Diagnosis not present

## 2018-04-01 DIAGNOSIS — I10 Essential (primary) hypertension: Secondary | ICD-10-CM | POA: Diagnosis not present

## 2018-04-02 DIAGNOSIS — Z0000 Encounter for general adult medical examination without abnormal findings: Secondary | ICD-10-CM | POA: Diagnosis not present

## 2018-04-02 DIAGNOSIS — Z139 Encounter for screening, unspecified: Secondary | ICD-10-CM | POA: Diagnosis not present

## 2018-04-10 DIAGNOSIS — I10 Essential (primary) hypertension: Secondary | ICD-10-CM | POA: Diagnosis not present

## 2018-04-10 DIAGNOSIS — E785 Hyperlipidemia, unspecified: Secondary | ICD-10-CM | POA: Diagnosis not present

## 2018-04-17 DIAGNOSIS — G894 Chronic pain syndrome: Secondary | ICD-10-CM | POA: Diagnosis not present

## 2018-04-17 DIAGNOSIS — M5412 Radiculopathy, cervical region: Secondary | ICD-10-CM | POA: Diagnosis not present

## 2018-04-17 DIAGNOSIS — M47812 Spondylosis without myelopathy or radiculopathy, cervical region: Secondary | ICD-10-CM | POA: Diagnosis not present

## 2018-04-17 DIAGNOSIS — G4700 Insomnia, unspecified: Secondary | ICD-10-CM | POA: Diagnosis not present

## 2018-04-19 DIAGNOSIS — T22111A Burn of first degree of right forearm, initial encounter: Secondary | ICD-10-CM | POA: Diagnosis not present

## 2018-04-20 DIAGNOSIS — T2220XA Burn of second degree of shoulder and upper limb, except wrist and hand, unspecified site, initial encounter: Secondary | ICD-10-CM | POA: Diagnosis not present

## 2018-04-23 ENCOUNTER — Other Ambulatory Visit: Payer: Self-pay | Admitting: Physical Medicine and Rehabilitation

## 2018-04-23 DIAGNOSIS — M5412 Radiculopathy, cervical region: Secondary | ICD-10-CM

## 2018-05-05 ENCOUNTER — Other Ambulatory Visit: Payer: Self-pay

## 2018-05-08 ENCOUNTER — Other Ambulatory Visit: Payer: Self-pay

## 2018-05-08 DIAGNOSIS — K210 Gastro-esophageal reflux disease with esophagitis: Secondary | ICD-10-CM | POA: Diagnosis not present

## 2018-05-08 DIAGNOSIS — I10 Essential (primary) hypertension: Secondary | ICD-10-CM | POA: Diagnosis not present

## 2018-05-08 DIAGNOSIS — E785 Hyperlipidemia, unspecified: Secondary | ICD-10-CM | POA: Diagnosis not present

## 2018-05-11 DIAGNOSIS — B372 Candidiasis of skin and nail: Secondary | ICD-10-CM | POA: Diagnosis not present

## 2018-05-11 DIAGNOSIS — M79672 Pain in left foot: Secondary | ICD-10-CM | POA: Diagnosis not present

## 2018-05-11 DIAGNOSIS — S99922A Unspecified injury of left foot, initial encounter: Secondary | ICD-10-CM | POA: Diagnosis not present

## 2018-05-18 DIAGNOSIS — M5412 Radiculopathy, cervical region: Secondary | ICD-10-CM | POA: Diagnosis not present

## 2018-05-18 DIAGNOSIS — G4700 Insomnia, unspecified: Secondary | ICD-10-CM | POA: Diagnosis not present

## 2018-05-18 DIAGNOSIS — G894 Chronic pain syndrome: Secondary | ICD-10-CM | POA: Diagnosis not present

## 2018-05-18 DIAGNOSIS — M47812 Spondylosis without myelopathy or radiculopathy, cervical region: Secondary | ICD-10-CM | POA: Diagnosis not present

## 2018-05-21 ENCOUNTER — Other Ambulatory Visit: Payer: Self-pay

## 2018-06-09 DIAGNOSIS — E785 Hyperlipidemia, unspecified: Secondary | ICD-10-CM | POA: Diagnosis not present

## 2018-06-09 DIAGNOSIS — I10 Essential (primary) hypertension: Secondary | ICD-10-CM | POA: Diagnosis not present

## 2018-06-09 DIAGNOSIS — K210 Gastro-esophageal reflux disease with esophagitis: Secondary | ICD-10-CM | POA: Diagnosis not present

## 2018-06-15 DIAGNOSIS — G894 Chronic pain syndrome: Secondary | ICD-10-CM | POA: Diagnosis not present

## 2018-06-15 DIAGNOSIS — M5412 Radiculopathy, cervical region: Secondary | ICD-10-CM | POA: Diagnosis not present

## 2018-06-15 DIAGNOSIS — Z79891 Long term (current) use of opiate analgesic: Secondary | ICD-10-CM | POA: Diagnosis not present

## 2018-06-15 DIAGNOSIS — G4700 Insomnia, unspecified: Secondary | ICD-10-CM | POA: Diagnosis not present

## 2018-06-15 DIAGNOSIS — M47812 Spondylosis without myelopathy or radiculopathy, cervical region: Secondary | ICD-10-CM | POA: Diagnosis not present

## 2018-06-25 DIAGNOSIS — Z7189 Other specified counseling: Secondary | ICD-10-CM | POA: Diagnosis not present

## 2018-07-09 DIAGNOSIS — K210 Gastro-esophageal reflux disease with esophagitis: Secondary | ICD-10-CM | POA: Diagnosis not present

## 2018-07-09 DIAGNOSIS — E785 Hyperlipidemia, unspecified: Secondary | ICD-10-CM | POA: Diagnosis not present

## 2018-07-09 DIAGNOSIS — Z7189 Other specified counseling: Secondary | ICD-10-CM | POA: Diagnosis not present

## 2018-07-09 DIAGNOSIS — I10 Essential (primary) hypertension: Secondary | ICD-10-CM | POA: Diagnosis not present

## 2018-07-13 DIAGNOSIS — G4700 Insomnia, unspecified: Secondary | ICD-10-CM | POA: Diagnosis not present

## 2018-07-13 DIAGNOSIS — G894 Chronic pain syndrome: Secondary | ICD-10-CM | POA: Diagnosis not present

## 2018-07-13 DIAGNOSIS — M47812 Spondylosis without myelopathy or radiculopathy, cervical region: Secondary | ICD-10-CM | POA: Diagnosis not present

## 2018-07-13 DIAGNOSIS — M5412 Radiculopathy, cervical region: Secondary | ICD-10-CM | POA: Diagnosis not present

## 2018-07-16 DIAGNOSIS — Z7189 Other specified counseling: Secondary | ICD-10-CM | POA: Diagnosis not present

## 2018-07-16 DIAGNOSIS — L255 Unspecified contact dermatitis due to plants, except food: Secondary | ICD-10-CM | POA: Diagnosis not present

## 2018-08-07 DIAGNOSIS — I10 Essential (primary) hypertension: Secondary | ICD-10-CM | POA: Diagnosis not present

## 2018-08-07 DIAGNOSIS — E785 Hyperlipidemia, unspecified: Secondary | ICD-10-CM | POA: Diagnosis not present

## 2018-08-07 DIAGNOSIS — K210 Gastro-esophageal reflux disease with esophagitis: Secondary | ICD-10-CM | POA: Diagnosis not present

## 2018-08-14 DIAGNOSIS — M5412 Radiculopathy, cervical region: Secondary | ICD-10-CM | POA: Diagnosis not present

## 2018-08-14 DIAGNOSIS — G894 Chronic pain syndrome: Secondary | ICD-10-CM | POA: Diagnosis not present

## 2018-08-14 DIAGNOSIS — M47812 Spondylosis without myelopathy or radiculopathy, cervical region: Secondary | ICD-10-CM | POA: Diagnosis not present

## 2018-08-14 DIAGNOSIS — G4700 Insomnia, unspecified: Secondary | ICD-10-CM | POA: Diagnosis not present

## 2018-09-08 DIAGNOSIS — E785 Hyperlipidemia, unspecified: Secondary | ICD-10-CM | POA: Diagnosis not present

## 2018-09-08 DIAGNOSIS — I10 Essential (primary) hypertension: Secondary | ICD-10-CM | POA: Diagnosis not present

## 2018-09-08 DIAGNOSIS — K210 Gastro-esophageal reflux disease with esophagitis: Secondary | ICD-10-CM | POA: Diagnosis not present

## 2018-09-22 DIAGNOSIS — E785 Hyperlipidemia, unspecified: Secondary | ICD-10-CM | POA: Diagnosis not present

## 2018-09-22 DIAGNOSIS — I10 Essential (primary) hypertension: Secondary | ICD-10-CM | POA: Diagnosis not present

## 2018-10-02 DIAGNOSIS — E785 Hyperlipidemia, unspecified: Secondary | ICD-10-CM | POA: Diagnosis not present

## 2018-10-02 DIAGNOSIS — I10 Essential (primary) hypertension: Secondary | ICD-10-CM | POA: Diagnosis not present

## 2018-10-08 DIAGNOSIS — M5412 Radiculopathy, cervical region: Secondary | ICD-10-CM | POA: Diagnosis not present

## 2018-10-08 DIAGNOSIS — G894 Chronic pain syndrome: Secondary | ICD-10-CM | POA: Diagnosis not present

## 2018-10-08 DIAGNOSIS — G4700 Insomnia, unspecified: Secondary | ICD-10-CM | POA: Diagnosis not present

## 2018-10-08 DIAGNOSIS — M47812 Spondylosis without myelopathy or radiculopathy, cervical region: Secondary | ICD-10-CM | POA: Diagnosis not present

## 2018-10-09 DIAGNOSIS — I10 Essential (primary) hypertension: Secondary | ICD-10-CM | POA: Diagnosis not present

## 2018-10-09 DIAGNOSIS — E785 Hyperlipidemia, unspecified: Secondary | ICD-10-CM | POA: Diagnosis not present

## 2018-11-05 DIAGNOSIS — G894 Chronic pain syndrome: Secondary | ICD-10-CM | POA: Diagnosis not present

## 2018-11-05 DIAGNOSIS — M5412 Radiculopathy, cervical region: Secondary | ICD-10-CM | POA: Diagnosis not present

## 2018-11-05 DIAGNOSIS — G4700 Insomnia, unspecified: Secondary | ICD-10-CM | POA: Diagnosis not present

## 2018-11-05 DIAGNOSIS — M47812 Spondylosis without myelopathy or radiculopathy, cervical region: Secondary | ICD-10-CM | POA: Diagnosis not present

## 2018-11-09 DIAGNOSIS — I10 Essential (primary) hypertension: Secondary | ICD-10-CM | POA: Diagnosis not present

## 2018-11-09 DIAGNOSIS — E785 Hyperlipidemia, unspecified: Secondary | ICD-10-CM | POA: Diagnosis not present

## 2018-11-18 DIAGNOSIS — L255 Unspecified contact dermatitis due to plants, except food: Secondary | ICD-10-CM | POA: Diagnosis not present

## 2018-11-25 ENCOUNTER — Other Ambulatory Visit: Payer: Self-pay | Admitting: Family Medicine

## 2018-11-25 DIAGNOSIS — L255 Unspecified contact dermatitis due to plants, except food: Secondary | ICD-10-CM

## 2018-11-25 NOTE — Telephone Encounter (Signed)
Requested medication (s) are due for refill today: yes  Requested medication (s) are on the active medication list: yes  Future visit scheduled: no  Notes to clinic:  Review for refill  Requested Prescriptions  Pending Prescriptions Disp Refills   famotidine (PEPCID) 20 MG tablet [Pharmacy Med Name: FAMOTIDINE 20 MG TABLET] 30 tablet 0    Sig: TAKE 1 TABLET BY MOUTH TWICE A DAY     Gastroenterology:  H2 Antagonists Failed - 11/25/2018  8:32 AM      Failed - Valid encounter within last 12 months    Recent Outpatient Visits    None

## 2018-12-01 DIAGNOSIS — M542 Cervicalgia: Secondary | ICD-10-CM | POA: Diagnosis not present

## 2018-12-01 DIAGNOSIS — M5412 Radiculopathy, cervical region: Secondary | ICD-10-CM | POA: Diagnosis not present

## 2018-12-03 DIAGNOSIS — M47812 Spondylosis without myelopathy or radiculopathy, cervical region: Secondary | ICD-10-CM | POA: Diagnosis not present

## 2018-12-03 DIAGNOSIS — G4700 Insomnia, unspecified: Secondary | ICD-10-CM | POA: Diagnosis not present

## 2018-12-03 DIAGNOSIS — G894 Chronic pain syndrome: Secondary | ICD-10-CM | POA: Diagnosis not present

## 2018-12-03 DIAGNOSIS — M5412 Radiculopathy, cervical region: Secondary | ICD-10-CM | POA: Diagnosis not present

## 2018-12-09 DIAGNOSIS — I10 Essential (primary) hypertension: Secondary | ICD-10-CM | POA: Diagnosis not present

## 2018-12-09 DIAGNOSIS — E785 Hyperlipidemia, unspecified: Secondary | ICD-10-CM | POA: Diagnosis not present

## 2018-12-10 ENCOUNTER — Other Ambulatory Visit: Payer: Self-pay | Admitting: Family Medicine

## 2018-12-10 DIAGNOSIS — L255 Unspecified contact dermatitis due to plants, except food: Secondary | ICD-10-CM

## 2018-12-23 DIAGNOSIS — E638 Other specified nutritional deficiencies: Secondary | ICD-10-CM | POA: Diagnosis not present

## 2018-12-30 DIAGNOSIS — M5412 Radiculopathy, cervical region: Secondary | ICD-10-CM | POA: Diagnosis not present

## 2018-12-30 DIAGNOSIS — G4700 Insomnia, unspecified: Secondary | ICD-10-CM | POA: Diagnosis not present

## 2018-12-30 DIAGNOSIS — M47812 Spondylosis without myelopathy or radiculopathy, cervical region: Secondary | ICD-10-CM | POA: Diagnosis not present

## 2018-12-30 DIAGNOSIS — G894 Chronic pain syndrome: Secondary | ICD-10-CM | POA: Diagnosis not present

## 2019-01-08 DIAGNOSIS — E785 Hyperlipidemia, unspecified: Secondary | ICD-10-CM | POA: Diagnosis not present

## 2019-01-08 DIAGNOSIS — I10 Essential (primary) hypertension: Secondary | ICD-10-CM | POA: Diagnosis not present

## 2019-01-19 DIAGNOSIS — E785 Hyperlipidemia, unspecified: Secondary | ICD-10-CM | POA: Diagnosis not present

## 2019-01-21 DIAGNOSIS — R600 Localized edema: Secondary | ICD-10-CM | POA: Diagnosis not present

## 2019-01-21 DIAGNOSIS — R0600 Dyspnea, unspecified: Secondary | ICD-10-CM | POA: Diagnosis not present

## 2019-01-21 DIAGNOSIS — R0789 Other chest pain: Secondary | ICD-10-CM | POA: Diagnosis not present

## 2019-01-27 DIAGNOSIS — R0600 Dyspnea, unspecified: Secondary | ICD-10-CM

## 2019-01-28 DIAGNOSIS — M5412 Radiculopathy, cervical region: Secondary | ICD-10-CM | POA: Diagnosis not present

## 2019-01-28 DIAGNOSIS — G894 Chronic pain syndrome: Secondary | ICD-10-CM | POA: Diagnosis not present

## 2019-01-28 DIAGNOSIS — G4700 Insomnia, unspecified: Secondary | ICD-10-CM | POA: Diagnosis not present

## 2019-01-28 DIAGNOSIS — M47812 Spondylosis without myelopathy or radiculopathy, cervical region: Secondary | ICD-10-CM | POA: Diagnosis not present

## 2019-02-08 DIAGNOSIS — I10 Essential (primary) hypertension: Secondary | ICD-10-CM | POA: Diagnosis not present

## 2019-02-08 DIAGNOSIS — E785 Hyperlipidemia, unspecified: Secondary | ICD-10-CM | POA: Diagnosis not present

## 2019-02-09 DIAGNOSIS — Z23 Encounter for immunization: Secondary | ICD-10-CM | POA: Diagnosis not present

## 2019-02-09 DIAGNOSIS — R600 Localized edema: Secondary | ICD-10-CM | POA: Diagnosis not present

## 2019-02-09 DIAGNOSIS — R0789 Other chest pain: Secondary | ICD-10-CM | POA: Diagnosis not present

## 2019-03-10 DIAGNOSIS — I10 Essential (primary) hypertension: Secondary | ICD-10-CM | POA: Diagnosis not present

## 2019-03-10 DIAGNOSIS — R0600 Dyspnea, unspecified: Secondary | ICD-10-CM | POA: Diagnosis not present

## 2019-03-23 DIAGNOSIS — Z0000 Encounter for general adult medical examination without abnormal findings: Secondary | ICD-10-CM | POA: Diagnosis not present

## 2019-03-23 DIAGNOSIS — Z139 Encounter for screening, unspecified: Secondary | ICD-10-CM | POA: Diagnosis not present

## 2019-03-23 DIAGNOSIS — Z7189 Other specified counseling: Secondary | ICD-10-CM | POA: Diagnosis not present

## 2019-03-23 DIAGNOSIS — Z136 Encounter for screening for cardiovascular disorders: Secondary | ICD-10-CM | POA: Diagnosis not present

## 2019-03-23 DIAGNOSIS — I7300 Raynaud's syndrome without gangrene: Secondary | ICD-10-CM | POA: Diagnosis not present

## 2019-03-25 DIAGNOSIS — G894 Chronic pain syndrome: Secondary | ICD-10-CM | POA: Diagnosis not present

## 2019-03-25 DIAGNOSIS — G4700 Insomnia, unspecified: Secondary | ICD-10-CM | POA: Diagnosis not present

## 2019-03-25 DIAGNOSIS — M47812 Spondylosis without myelopathy or radiculopathy, cervical region: Secondary | ICD-10-CM | POA: Diagnosis not present

## 2019-03-25 DIAGNOSIS — M5412 Radiculopathy, cervical region: Secondary | ICD-10-CM | POA: Diagnosis not present

## 2019-04-09 DIAGNOSIS — I7300 Raynaud's syndrome without gangrene: Secondary | ICD-10-CM | POA: Diagnosis not present

## 2019-04-26 DIAGNOSIS — G4700 Insomnia, unspecified: Secondary | ICD-10-CM | POA: Diagnosis not present

## 2019-04-26 DIAGNOSIS — M47812 Spondylosis without myelopathy or radiculopathy, cervical region: Secondary | ICD-10-CM | POA: Diagnosis not present

## 2019-04-26 DIAGNOSIS — M5412 Radiculopathy, cervical region: Secondary | ICD-10-CM | POA: Diagnosis not present

## 2019-04-26 DIAGNOSIS — G894 Chronic pain syndrome: Secondary | ICD-10-CM | POA: Diagnosis not present

## 2019-04-27 DIAGNOSIS — G5602 Carpal tunnel syndrome, left upper limb: Secondary | ICD-10-CM | POA: Diagnosis not present

## 2019-04-27 DIAGNOSIS — M5412 Radiculopathy, cervical region: Secondary | ICD-10-CM | POA: Diagnosis not present

## 2019-05-09 DIAGNOSIS — E785 Hyperlipidemia, unspecified: Secondary | ICD-10-CM | POA: Diagnosis not present

## 2019-05-09 DIAGNOSIS — I10 Essential (primary) hypertension: Secondary | ICD-10-CM | POA: Diagnosis not present

## 2019-05-09 DIAGNOSIS — R0600 Dyspnea, unspecified: Secondary | ICD-10-CM | POA: Diagnosis not present

## 2019-05-24 DIAGNOSIS — M47812 Spondylosis without myelopathy or radiculopathy, cervical region: Secondary | ICD-10-CM | POA: Diagnosis not present

## 2019-05-24 DIAGNOSIS — M5412 Radiculopathy, cervical region: Secondary | ICD-10-CM | POA: Diagnosis not present

## 2019-05-24 DIAGNOSIS — G4700 Insomnia, unspecified: Secondary | ICD-10-CM | POA: Diagnosis not present

## 2019-05-24 DIAGNOSIS — G894 Chronic pain syndrome: Secondary | ICD-10-CM | POA: Diagnosis not present

## 2019-05-24 DIAGNOSIS — M25531 Pain in right wrist: Secondary | ICD-10-CM | POA: Diagnosis not present

## 2019-06-02 DIAGNOSIS — R6889 Other general symptoms and signs: Secondary | ICD-10-CM | POA: Diagnosis not present

## 2019-06-09 DIAGNOSIS — E785 Hyperlipidemia, unspecified: Secondary | ICD-10-CM | POA: Diagnosis not present

## 2019-06-22 DIAGNOSIS — G4700 Insomnia, unspecified: Secondary | ICD-10-CM | POA: Diagnosis not present

## 2019-06-22 DIAGNOSIS — G894 Chronic pain syndrome: Secondary | ICD-10-CM | POA: Diagnosis not present

## 2019-06-22 DIAGNOSIS — M5412 Radiculopathy, cervical region: Secondary | ICD-10-CM | POA: Diagnosis not present

## 2019-06-22 DIAGNOSIS — M47812 Spondylosis without myelopathy or radiculopathy, cervical region: Secondary | ICD-10-CM | POA: Diagnosis not present

## 2019-07-06 DIAGNOSIS — E785 Hyperlipidemia, unspecified: Secondary | ICD-10-CM | POA: Diagnosis not present

## 2019-07-09 DIAGNOSIS — E785 Hyperlipidemia, unspecified: Secondary | ICD-10-CM | POA: Diagnosis not present

## 2019-07-22 DIAGNOSIS — M47812 Spondylosis without myelopathy or radiculopathy, cervical region: Secondary | ICD-10-CM | POA: Diagnosis not present

## 2019-07-22 DIAGNOSIS — G4700 Insomnia, unspecified: Secondary | ICD-10-CM | POA: Diagnosis not present

## 2019-07-22 DIAGNOSIS — M5412 Radiculopathy, cervical region: Secondary | ICD-10-CM | POA: Diagnosis not present

## 2019-07-22 DIAGNOSIS — G894 Chronic pain syndrome: Secondary | ICD-10-CM | POA: Diagnosis not present

## 2019-08-09 DIAGNOSIS — E785 Hyperlipidemia, unspecified: Secondary | ICD-10-CM | POA: Diagnosis not present

## 2019-08-19 DIAGNOSIS — G894 Chronic pain syndrome: Secondary | ICD-10-CM | POA: Diagnosis not present

## 2019-08-19 DIAGNOSIS — M5412 Radiculopathy, cervical region: Secondary | ICD-10-CM | POA: Diagnosis not present

## 2019-08-19 DIAGNOSIS — M47812 Spondylosis without myelopathy or radiculopathy, cervical region: Secondary | ICD-10-CM | POA: Diagnosis not present

## 2019-08-19 DIAGNOSIS — G4700 Insomnia, unspecified: Secondary | ICD-10-CM | POA: Diagnosis not present

## 2019-09-08 DIAGNOSIS — E785 Hyperlipidemia, unspecified: Secondary | ICD-10-CM | POA: Diagnosis not present

## 2019-09-20 DIAGNOSIS — M47812 Spondylosis without myelopathy or radiculopathy, cervical region: Secondary | ICD-10-CM | POA: Diagnosis not present

## 2019-09-20 DIAGNOSIS — G894 Chronic pain syndrome: Secondary | ICD-10-CM | POA: Diagnosis not present

## 2019-09-20 DIAGNOSIS — M5412 Radiculopathy, cervical region: Secondary | ICD-10-CM | POA: Diagnosis not present

## 2019-09-20 DIAGNOSIS — G4700 Insomnia, unspecified: Secondary | ICD-10-CM | POA: Diagnosis not present

## 2019-10-10 DIAGNOSIS — E785 Hyperlipidemia, unspecified: Secondary | ICD-10-CM | POA: Diagnosis not present

## 2019-11-09 DIAGNOSIS — E785 Hyperlipidemia, unspecified: Secondary | ICD-10-CM | POA: Diagnosis not present

## 2019-11-10 DIAGNOSIS — E785 Hyperlipidemia, unspecified: Secondary | ICD-10-CM | POA: Diagnosis not present

## 2019-11-16 DIAGNOSIS — Z20822 Contact with and (suspected) exposure to covid-19: Secondary | ICD-10-CM | POA: Diagnosis not present

## 2019-12-01 DIAGNOSIS — G4700 Insomnia, unspecified: Secondary | ICD-10-CM | POA: Diagnosis not present

## 2019-12-01 DIAGNOSIS — G894 Chronic pain syndrome: Secondary | ICD-10-CM | POA: Diagnosis not present

## 2019-12-01 DIAGNOSIS — Z79891 Long term (current) use of opiate analgesic: Secondary | ICD-10-CM | POA: Diagnosis not present

## 2019-12-01 DIAGNOSIS — M47812 Spondylosis without myelopathy or radiculopathy, cervical region: Secondary | ICD-10-CM | POA: Diagnosis not present

## 2019-12-01 DIAGNOSIS — M5412 Radiculopathy, cervical region: Secondary | ICD-10-CM | POA: Diagnosis not present

## 2019-12-08 DIAGNOSIS — G5603 Carpal tunnel syndrome, bilateral upper limbs: Secondary | ICD-10-CM | POA: Diagnosis not present

## 2019-12-08 DIAGNOSIS — M5412 Radiculopathy, cervical region: Secondary | ICD-10-CM | POA: Diagnosis not present

## 2019-12-08 DIAGNOSIS — G5622 Lesion of ulnar nerve, left upper limb: Secondary | ICD-10-CM | POA: Diagnosis not present

## 2019-12-10 DIAGNOSIS — E785 Hyperlipidemia, unspecified: Secondary | ICD-10-CM | POA: Diagnosis not present

## 2019-12-20 DIAGNOSIS — Z23 Encounter for immunization: Secondary | ICD-10-CM | POA: Diagnosis not present

## 2019-12-20 DIAGNOSIS — Q742 Other congenital malformations of lower limb(s), including pelvic girdle: Secondary | ICD-10-CM | POA: Diagnosis not present

## 2019-12-20 DIAGNOSIS — U099 Post covid-19 condition, unspecified: Secondary | ICD-10-CM | POA: Diagnosis not present

## 2019-12-20 DIAGNOSIS — R0602 Shortness of breath: Secondary | ICD-10-CM | POA: Diagnosis not present

## 2019-12-20 DIAGNOSIS — R0600 Dyspnea, unspecified: Secondary | ICD-10-CM | POA: Diagnosis not present

## 2019-12-29 DIAGNOSIS — M47812 Spondylosis without myelopathy or radiculopathy, cervical region: Secondary | ICD-10-CM | POA: Diagnosis not present

## 2019-12-29 DIAGNOSIS — G894 Chronic pain syndrome: Secondary | ICD-10-CM | POA: Diagnosis not present

## 2019-12-29 DIAGNOSIS — M5412 Radiculopathy, cervical region: Secondary | ICD-10-CM | POA: Diagnosis not present

## 2019-12-29 DIAGNOSIS — G4700 Insomnia, unspecified: Secondary | ICD-10-CM | POA: Diagnosis not present

## 2020-01-10 DIAGNOSIS — E785 Hyperlipidemia, unspecified: Secondary | ICD-10-CM | POA: Diagnosis not present

## 2020-01-27 DIAGNOSIS — G4700 Insomnia, unspecified: Secondary | ICD-10-CM | POA: Diagnosis not present

## 2020-01-27 DIAGNOSIS — G894 Chronic pain syndrome: Secondary | ICD-10-CM | POA: Diagnosis not present

## 2020-01-27 DIAGNOSIS — M47812 Spondylosis without myelopathy or radiculopathy, cervical region: Secondary | ICD-10-CM | POA: Diagnosis not present

## 2020-01-27 DIAGNOSIS — M5412 Radiculopathy, cervical region: Secondary | ICD-10-CM | POA: Diagnosis not present

## 2020-02-09 DIAGNOSIS — E785 Hyperlipidemia, unspecified: Secondary | ICD-10-CM | POA: Diagnosis not present

## 2020-02-16 DIAGNOSIS — M5416 Radiculopathy, lumbar region: Secondary | ICD-10-CM | POA: Diagnosis not present

## 2020-02-16 DIAGNOSIS — M47817 Spondylosis without myelopathy or radiculopathy, lumbosacral region: Secondary | ICD-10-CM | POA: Diagnosis not present

## 2020-02-24 DIAGNOSIS — G894 Chronic pain syndrome: Secondary | ICD-10-CM | POA: Diagnosis not present

## 2020-02-24 DIAGNOSIS — G4700 Insomnia, unspecified: Secondary | ICD-10-CM | POA: Diagnosis not present

## 2020-02-24 DIAGNOSIS — M5412 Radiculopathy, cervical region: Secondary | ICD-10-CM | POA: Diagnosis not present

## 2020-02-24 DIAGNOSIS — M47812 Spondylosis without myelopathy or radiculopathy, cervical region: Secondary | ICD-10-CM | POA: Diagnosis not present

## 2020-03-08 DIAGNOSIS — M4802 Spinal stenosis, cervical region: Secondary | ICD-10-CM | POA: Diagnosis not present

## 2020-03-08 DIAGNOSIS — M542 Cervicalgia: Secondary | ICD-10-CM | POA: Diagnosis not present

## 2020-03-11 DIAGNOSIS — E785 Hyperlipidemia, unspecified: Secondary | ICD-10-CM | POA: Diagnosis not present

## 2020-03-21 DIAGNOSIS — E785 Hyperlipidemia, unspecified: Secondary | ICD-10-CM | POA: Diagnosis not present

## 2020-03-23 DIAGNOSIS — G894 Chronic pain syndrome: Secondary | ICD-10-CM | POA: Diagnosis not present

## 2020-03-23 DIAGNOSIS — M5412 Radiculopathy, cervical region: Secondary | ICD-10-CM | POA: Diagnosis not present

## 2020-03-23 DIAGNOSIS — G4700 Insomnia, unspecified: Secondary | ICD-10-CM | POA: Diagnosis not present

## 2020-03-23 DIAGNOSIS — M47812 Spondylosis without myelopathy or radiculopathy, cervical region: Secondary | ICD-10-CM | POA: Diagnosis not present

## 2020-04-11 DIAGNOSIS — E785 Hyperlipidemia, unspecified: Secondary | ICD-10-CM | POA: Diagnosis not present

## 2020-04-20 DIAGNOSIS — M5412 Radiculopathy, cervical region: Secondary | ICD-10-CM | POA: Diagnosis not present

## 2020-04-20 DIAGNOSIS — M47812 Spondylosis without myelopathy or radiculopathy, cervical region: Secondary | ICD-10-CM | POA: Diagnosis not present

## 2020-04-20 DIAGNOSIS — G4700 Insomnia, unspecified: Secondary | ICD-10-CM | POA: Diagnosis not present

## 2020-04-20 DIAGNOSIS — G894 Chronic pain syndrome: Secondary | ICD-10-CM | POA: Diagnosis not present

## 2020-05-05 ENCOUNTER — Telehealth: Payer: Self-pay | Admitting: Gastroenterology

## 2020-05-05 DIAGNOSIS — Z1211 Encounter for screening for malignant neoplasm of colon: Secondary | ICD-10-CM | POA: Diagnosis not present

## 2020-05-05 DIAGNOSIS — Z136 Encounter for screening for cardiovascular disorders: Secondary | ICD-10-CM | POA: Diagnosis not present

## 2020-05-05 DIAGNOSIS — Z0000 Encounter for general adult medical examination without abnormal findings: Secondary | ICD-10-CM | POA: Diagnosis not present

## 2020-05-05 DIAGNOSIS — L659 Nonscarring hair loss, unspecified: Secondary | ICD-10-CM | POA: Diagnosis not present

## 2020-05-05 DIAGNOSIS — E785 Hyperlipidemia, unspecified: Secondary | ICD-10-CM | POA: Diagnosis not present

## 2020-05-05 DIAGNOSIS — D229 Melanocytic nevi, unspecified: Secondary | ICD-10-CM | POA: Diagnosis not present

## 2020-05-05 DIAGNOSIS — Z139 Encounter for screening, unspecified: Secondary | ICD-10-CM | POA: Diagnosis not present

## 2020-05-05 DIAGNOSIS — I10 Essential (primary) hypertension: Secondary | ICD-10-CM | POA: Diagnosis not present

## 2020-05-05 NOTE — Telephone Encounter (Signed)
Hey Dr Lyndel Safe, this pt states she had a colonoscopy with you in 2017 [place], she states she needs a 5 year recall and is due for another one, would it be ok to schedule.

## 2020-05-06 NOTE — Telephone Encounter (Signed)
OK to schedule RG 

## 2020-05-09 DIAGNOSIS — I10 Essential (primary) hypertension: Secondary | ICD-10-CM | POA: Diagnosis not present

## 2020-05-09 DIAGNOSIS — E785 Hyperlipidemia, unspecified: Secondary | ICD-10-CM | POA: Diagnosis not present

## 2020-05-22 DIAGNOSIS — M47812 Spondylosis without myelopathy or radiculopathy, cervical region: Secondary | ICD-10-CM | POA: Diagnosis not present

## 2020-05-22 DIAGNOSIS — G894 Chronic pain syndrome: Secondary | ICD-10-CM | POA: Diagnosis not present

## 2020-05-22 DIAGNOSIS — M5412 Radiculopathy, cervical region: Secondary | ICD-10-CM | POA: Diagnosis not present

## 2020-05-22 DIAGNOSIS — G4700 Insomnia, unspecified: Secondary | ICD-10-CM | POA: Diagnosis not present

## 2020-05-24 ENCOUNTER — Telehealth: Payer: Self-pay | Admitting: Physician Assistant

## 2020-05-24 NOTE — Telephone Encounter (Signed)
Patient is calling for a referral appointment from Marco Collie, MD.  Patient is scheduled for 10/18/2020 at 9:30 with [facility], PA-C.

## 2020-05-25 NOTE — Telephone Encounter (Signed)
Referral attached to appointment

## 2020-06-09 DIAGNOSIS — I10 Essential (primary) hypertension: Secondary | ICD-10-CM | POA: Diagnosis not present

## 2020-06-19 DIAGNOSIS — M47812 Spondylosis without myelopathy or radiculopathy, cervical region: Secondary | ICD-10-CM | POA: Diagnosis not present

## 2020-06-19 DIAGNOSIS — G894 Chronic pain syndrome: Secondary | ICD-10-CM | POA: Diagnosis not present

## 2020-06-19 DIAGNOSIS — M5412 Radiculopathy, cervical region: Secondary | ICD-10-CM | POA: Diagnosis not present

## 2020-06-19 DIAGNOSIS — G4700 Insomnia, unspecified: Secondary | ICD-10-CM | POA: Diagnosis not present

## 2020-06-20 DIAGNOSIS — Z1231 Encounter for screening mammogram for malignant neoplasm of breast: Secondary | ICD-10-CM | POA: Diagnosis not present

## 2020-07-06 DIAGNOSIS — R1011 Right upper quadrant pain: Secondary | ICD-10-CM | POA: Diagnosis not present

## 2020-07-09 DIAGNOSIS — E785 Hyperlipidemia, unspecified: Secondary | ICD-10-CM | POA: Diagnosis not present

## 2020-07-09 DIAGNOSIS — I10 Essential (primary) hypertension: Secondary | ICD-10-CM | POA: Diagnosis not present

## 2020-07-10 ENCOUNTER — Telehealth: Payer: Self-pay | Admitting: *Deleted

## 2020-07-10 ENCOUNTER — Ambulatory Visit (AMBULATORY_SURGERY_CENTER): Payer: Self-pay | Admitting: *Deleted

## 2020-07-10 ENCOUNTER — Other Ambulatory Visit: Payer: Self-pay

## 2020-07-10 VITALS — Ht <= 58 in | Wt 219.8 lb

## 2020-07-10 DIAGNOSIS — Z86010 Personal history of colonic polyps: Secondary | ICD-10-CM

## 2020-07-10 MED ORDER — SUTAB 1479-225-188 MG PO TABS
1.0000 | ORAL_TABLET | Freq: Once | ORAL | 1 refills | Status: AC
Start: 2020-07-10 — End: 2020-07-10

## 2020-07-10 NOTE — Telephone Encounter (Signed)
Dr. Lyndel Safe,  I saw this pt at [facility] today.  Her BMI is 89.79- she is 3'5.5 inches tall and 219 lbs.  Would you like an OV or direct to the hospital?   Thank you, Cyril Mourning

## 2020-07-10 NOTE — Progress Notes (Signed)
Pt's BMI is 89.79- pt is 3'5.5 inches and 219.8lb.  Pt made aware that she will have to be done at the hospital.  PV finished and Sutab sent into pharmacy.  TE sent to Dr. Lyndel Safe to make him aware.

## 2020-07-12 DIAGNOSIS — K760 Fatty (change of) liver, not elsewhere classified: Secondary | ICD-10-CM | POA: Diagnosis not present

## 2020-07-12 DIAGNOSIS — R1011 Right upper quadrant pain: Secondary | ICD-10-CM | POA: Diagnosis not present

## 2020-07-12 NOTE — Telephone Encounter (Signed)
Attempted to call pt to make OV with Dr Lyndel Safe- called 2 x- no answer, no voice mail to [facility]

## 2020-07-12 NOTE — Telephone Encounter (Signed)
Lets do OV first. If no openings in my clinic, can work her into [place]

## 2020-07-17 DIAGNOSIS — M47812 Spondylosis without myelopathy or radiculopathy, cervical region: Secondary | ICD-10-CM | POA: Diagnosis not present

## 2020-07-17 DIAGNOSIS — M5412 Radiculopathy, cervical region: Secondary | ICD-10-CM | POA: Diagnosis not present

## 2020-07-17 DIAGNOSIS — G894 Chronic pain syndrome: Secondary | ICD-10-CM | POA: Diagnosis not present

## 2020-07-17 DIAGNOSIS — G4700 Insomnia, unspecified: Secondary | ICD-10-CM | POA: Diagnosis not present

## 2020-07-17 NOTE — Telephone Encounter (Signed)
Called patient. No answer, no answering machine unable to leave a message.

## 2020-07-17 NOTE — Telephone Encounter (Signed)
Explained Dr.Gupta's recommendations. Made OV with the patient for 6/28. Mailed appointment AVS to the patient.

## 2020-07-18 ENCOUNTER — Encounter: Payer: Self-pay | Admitting: Gastroenterology

## 2020-07-27 DIAGNOSIS — Z0000 Encounter for general adult medical examination without abnormal findings: Secondary | ICD-10-CM | POA: Diagnosis not present

## 2020-08-03 DIAGNOSIS — K760 Fatty (change of) liver, not elsewhere classified: Secondary | ICD-10-CM | POA: Diagnosis not present

## 2020-08-09 DIAGNOSIS — I10 Essential (primary) hypertension: Secondary | ICD-10-CM | POA: Diagnosis not present

## 2020-08-09 DIAGNOSIS — E785 Hyperlipidemia, unspecified: Secondary | ICD-10-CM | POA: Diagnosis not present

## 2020-08-14 DIAGNOSIS — M47812 Spondylosis without myelopathy or radiculopathy, cervical region: Secondary | ICD-10-CM | POA: Diagnosis not present

## 2020-08-14 DIAGNOSIS — M5412 Radiculopathy, cervical region: Secondary | ICD-10-CM | POA: Diagnosis not present

## 2020-08-14 DIAGNOSIS — G894 Chronic pain syndrome: Secondary | ICD-10-CM | POA: Diagnosis not present

## 2020-08-14 DIAGNOSIS — G4700 Insomnia, unspecified: Secondary | ICD-10-CM | POA: Diagnosis not present

## 2020-08-14 DIAGNOSIS — Z79891 Long term (current) use of opiate analgesic: Secondary | ICD-10-CM | POA: Diagnosis not present

## 2020-08-21 DIAGNOSIS — L309 Dermatitis, unspecified: Secondary | ICD-10-CM | POA: Diagnosis not present

## 2020-08-25 DIAGNOSIS — E785 Hyperlipidemia, unspecified: Secondary | ICD-10-CM | POA: Diagnosis not present

## 2020-08-25 DIAGNOSIS — I10 Essential (primary) hypertension: Secondary | ICD-10-CM | POA: Diagnosis not present

## 2020-08-31 DIAGNOSIS — M25579 Pain in unspecified ankle and joints of unspecified foot: Secondary | ICD-10-CM | POA: Diagnosis not present

## 2020-08-31 DIAGNOSIS — W19XXXA Unspecified fall, initial encounter: Secondary | ICD-10-CM | POA: Diagnosis not present

## 2020-08-31 DIAGNOSIS — Y92009 Unspecified place in unspecified non-institutional (private) residence as the place of occurrence of the external cause: Secondary | ICD-10-CM | POA: Diagnosis not present

## 2020-08-31 DIAGNOSIS — M549 Dorsalgia, unspecified: Secondary | ICD-10-CM | POA: Diagnosis not present

## 2020-09-05 ENCOUNTER — Ambulatory Visit: Payer: Medicare Other | Admitting: Gastroenterology

## 2020-09-05 DIAGNOSIS — Z20822 Contact with and (suspected) exposure to covid-19: Secondary | ICD-10-CM | POA: Diagnosis not present

## 2020-09-05 DIAGNOSIS — J029 Acute pharyngitis, unspecified: Secondary | ICD-10-CM | POA: Diagnosis not present

## 2020-09-08 DIAGNOSIS — I10 Essential (primary) hypertension: Secondary | ICD-10-CM | POA: Diagnosis not present

## 2020-09-08 DIAGNOSIS — E785 Hyperlipidemia, unspecified: Secondary | ICD-10-CM | POA: Diagnosis not present

## 2020-09-12 DIAGNOSIS — M47812 Spondylosis without myelopathy or radiculopathy, cervical region: Secondary | ICD-10-CM | POA: Diagnosis not present

## 2020-09-12 DIAGNOSIS — G894 Chronic pain syndrome: Secondary | ICD-10-CM | POA: Diagnosis not present

## 2020-09-12 DIAGNOSIS — G4700 Insomnia, unspecified: Secondary | ICD-10-CM | POA: Diagnosis not present

## 2020-09-12 DIAGNOSIS — M5412 Radiculopathy, cervical region: Secondary | ICD-10-CM | POA: Diagnosis not present

## 2020-10-09 DIAGNOSIS — E785 Hyperlipidemia, unspecified: Secondary | ICD-10-CM | POA: Diagnosis not present

## 2020-10-09 DIAGNOSIS — I10 Essential (primary) hypertension: Secondary | ICD-10-CM | POA: Diagnosis not present

## 2020-10-11 DIAGNOSIS — G4700 Insomnia, unspecified: Secondary | ICD-10-CM | POA: Diagnosis not present

## 2020-10-11 DIAGNOSIS — M47812 Spondylosis without myelopathy or radiculopathy, cervical region: Secondary | ICD-10-CM | POA: Diagnosis not present

## 2020-10-11 DIAGNOSIS — G894 Chronic pain syndrome: Secondary | ICD-10-CM | POA: Diagnosis not present

## 2020-10-11 DIAGNOSIS — M5412 Radiculopathy, cervical region: Secondary | ICD-10-CM | POA: Diagnosis not present

## 2020-10-16 DIAGNOSIS — W57XXXA Bitten or stung by nonvenomous insect and other nonvenomous arthropods, initial encounter: Secondary | ICD-10-CM | POA: Diagnosis not present

## 2020-10-16 DIAGNOSIS — S80861A Insect bite (nonvenomous), right lower leg, initial encounter: Secondary | ICD-10-CM | POA: Diagnosis not present

## 2020-10-17 DIAGNOSIS — S80861A Insect bite (nonvenomous), right lower leg, initial encounter: Secondary | ICD-10-CM | POA: Diagnosis not present

## 2020-10-18 ENCOUNTER — Ambulatory Visit: Payer: Self-pay | Admitting: Physician Assistant

## 2020-11-06 DIAGNOSIS — I10 Essential (primary) hypertension: Secondary | ICD-10-CM | POA: Diagnosis not present

## 2020-11-06 DIAGNOSIS — W57XXXA Bitten or stung by nonvenomous insect and other nonvenomous arthropods, initial encounter: Secondary | ICD-10-CM | POA: Diagnosis not present

## 2020-11-06 DIAGNOSIS — S80861A Insect bite (nonvenomous), right lower leg, initial encounter: Secondary | ICD-10-CM | POA: Diagnosis not present

## 2020-11-09 DIAGNOSIS — I10 Essential (primary) hypertension: Secondary | ICD-10-CM | POA: Diagnosis not present

## 2020-11-09 DIAGNOSIS — G4700 Insomnia, unspecified: Secondary | ICD-10-CM | POA: Diagnosis not present

## 2020-11-09 DIAGNOSIS — G894 Chronic pain syndrome: Secondary | ICD-10-CM | POA: Diagnosis not present

## 2020-11-09 DIAGNOSIS — E785 Hyperlipidemia, unspecified: Secondary | ICD-10-CM | POA: Diagnosis not present

## 2020-11-09 DIAGNOSIS — M47812 Spondylosis without myelopathy or radiculopathy, cervical region: Secondary | ICD-10-CM | POA: Diagnosis not present

## 2020-11-09 DIAGNOSIS — M5412 Radiculopathy, cervical region: Secondary | ICD-10-CM | POA: Diagnosis not present

## 2020-12-07 DIAGNOSIS — M47812 Spondylosis without myelopathy or radiculopathy, cervical region: Secondary | ICD-10-CM | POA: Diagnosis not present

## 2020-12-07 DIAGNOSIS — G4700 Insomnia, unspecified: Secondary | ICD-10-CM | POA: Diagnosis not present

## 2020-12-07 DIAGNOSIS — G894 Chronic pain syndrome: Secondary | ICD-10-CM | POA: Diagnosis not present

## 2020-12-07 DIAGNOSIS — M5412 Radiculopathy, cervical region: Secondary | ICD-10-CM | POA: Diagnosis not present

## 2020-12-09 DIAGNOSIS — I10 Essential (primary) hypertension: Secondary | ICD-10-CM | POA: Diagnosis not present

## 2020-12-09 DIAGNOSIS — E785 Hyperlipidemia, unspecified: Secondary | ICD-10-CM | POA: Diagnosis not present

## 2020-12-14 DIAGNOSIS — R059 Cough, unspecified: Secondary | ICD-10-CM | POA: Diagnosis not present

## 2020-12-14 DIAGNOSIS — J020 Streptococcal pharyngitis: Secondary | ICD-10-CM | POA: Diagnosis not present

## 2020-12-14 DIAGNOSIS — Z20822 Contact with and (suspected) exposure to covid-19: Secondary | ICD-10-CM | POA: Diagnosis not present

## 2021-01-02 DIAGNOSIS — E785 Hyperlipidemia, unspecified: Secondary | ICD-10-CM | POA: Diagnosis not present

## 2021-01-03 DIAGNOSIS — G894 Chronic pain syndrome: Secondary | ICD-10-CM | POA: Diagnosis not present

## 2021-01-03 DIAGNOSIS — M47812 Spondylosis without myelopathy or radiculopathy, cervical region: Secondary | ICD-10-CM | POA: Diagnosis not present

## 2021-01-03 DIAGNOSIS — M5412 Radiculopathy, cervical region: Secondary | ICD-10-CM | POA: Diagnosis not present

## 2021-01-03 DIAGNOSIS — G4700 Insomnia, unspecified: Secondary | ICD-10-CM | POA: Diagnosis not present

## 2021-01-08 DIAGNOSIS — I10 Essential (primary) hypertension: Secondary | ICD-10-CM | POA: Diagnosis not present

## 2021-01-08 DIAGNOSIS — E785 Hyperlipidemia, unspecified: Secondary | ICD-10-CM | POA: Diagnosis not present

## 2021-01-08 DIAGNOSIS — R7301 Impaired fasting glucose: Secondary | ICD-10-CM | POA: Diagnosis not present

## 2021-01-08 DIAGNOSIS — Z23 Encounter for immunization: Secondary | ICD-10-CM | POA: Diagnosis not present

## 2021-01-09 DIAGNOSIS — I10 Essential (primary) hypertension: Secondary | ICD-10-CM | POA: Diagnosis not present

## 2021-01-09 DIAGNOSIS — E785 Hyperlipidemia, unspecified: Secondary | ICD-10-CM | POA: Diagnosis not present

## 2021-01-30 DIAGNOSIS — M5412 Radiculopathy, cervical region: Secondary | ICD-10-CM | POA: Diagnosis not present

## 2021-01-30 DIAGNOSIS — G894 Chronic pain syndrome: Secondary | ICD-10-CM | POA: Diagnosis not present

## 2021-01-30 DIAGNOSIS — M47812 Spondylosis without myelopathy or radiculopathy, cervical region: Secondary | ICD-10-CM | POA: Diagnosis not present

## 2021-01-30 DIAGNOSIS — G4700 Insomnia, unspecified: Secondary | ICD-10-CM | POA: Diagnosis not present

## 2021-02-08 DIAGNOSIS — E785 Hyperlipidemia, unspecified: Secondary | ICD-10-CM | POA: Diagnosis not present

## 2021-02-08 DIAGNOSIS — I10 Essential (primary) hypertension: Secondary | ICD-10-CM | POA: Diagnosis not present

## 2021-03-07 DIAGNOSIS — G894 Chronic pain syndrome: Secondary | ICD-10-CM | POA: Diagnosis not present

## 2021-03-07 DIAGNOSIS — G4700 Insomnia, unspecified: Secondary | ICD-10-CM | POA: Diagnosis not present

## 2021-03-07 DIAGNOSIS — M47812 Spondylosis without myelopathy or radiculopathy, cervical region: Secondary | ICD-10-CM | POA: Diagnosis not present

## 2021-03-07 DIAGNOSIS — M5412 Radiculopathy, cervical region: Secondary | ICD-10-CM | POA: Diagnosis not present

## 2021-03-11 DIAGNOSIS — I10 Essential (primary) hypertension: Secondary | ICD-10-CM | POA: Diagnosis not present

## 2021-03-11 DIAGNOSIS — E785 Hyperlipidemia, unspecified: Secondary | ICD-10-CM | POA: Diagnosis not present

## 2021-04-04 DIAGNOSIS — M5412 Radiculopathy, cervical region: Secondary | ICD-10-CM | POA: Diagnosis not present

## 2021-04-04 DIAGNOSIS — G894 Chronic pain syndrome: Secondary | ICD-10-CM | POA: Diagnosis not present

## 2021-04-04 DIAGNOSIS — G4700 Insomnia, unspecified: Secondary | ICD-10-CM | POA: Diagnosis not present

## 2021-04-04 DIAGNOSIS — M47812 Spondylosis without myelopathy or radiculopathy, cervical region: Secondary | ICD-10-CM | POA: Diagnosis not present

## 2021-04-11 DIAGNOSIS — I10 Essential (primary) hypertension: Secondary | ICD-10-CM | POA: Diagnosis not present

## 2021-04-11 DIAGNOSIS — E785 Hyperlipidemia, unspecified: Secondary | ICD-10-CM | POA: Diagnosis not present

## 2021-04-24 DIAGNOSIS — L820 Inflamed seborrheic keratosis: Secondary | ICD-10-CM | POA: Diagnosis not present

## 2021-04-24 DIAGNOSIS — D1801 Hemangioma of skin and subcutaneous tissue: Secondary | ICD-10-CM | POA: Diagnosis not present

## 2021-04-24 DIAGNOSIS — L570 Actinic keratosis: Secondary | ICD-10-CM | POA: Diagnosis not present

## 2021-04-24 DIAGNOSIS — L84 Corns and callosities: Secondary | ICD-10-CM | POA: Diagnosis not present

## 2021-04-24 DIAGNOSIS — L538 Other specified erythematous conditions: Secondary | ICD-10-CM | POA: Diagnosis not present

## 2021-04-24 DIAGNOSIS — Z789 Other specified health status: Secondary | ICD-10-CM | POA: Diagnosis not present

## 2021-04-24 DIAGNOSIS — L298 Other pruritus: Secondary | ICD-10-CM | POA: Diagnosis not present

## 2021-05-08 DIAGNOSIS — Z79891 Long term (current) use of opiate analgesic: Secondary | ICD-10-CM | POA: Diagnosis not present

## 2021-05-08 DIAGNOSIS — M5412 Radiculopathy, cervical region: Secondary | ICD-10-CM | POA: Diagnosis not present

## 2021-05-08 DIAGNOSIS — G4700 Insomnia, unspecified: Secondary | ICD-10-CM | POA: Diagnosis not present

## 2021-05-08 DIAGNOSIS — G894 Chronic pain syndrome: Secondary | ICD-10-CM | POA: Diagnosis not present

## 2021-05-08 DIAGNOSIS — M47812 Spondylosis without myelopathy or radiculopathy, cervical region: Secondary | ICD-10-CM | POA: Diagnosis not present

## 2021-05-09 DIAGNOSIS — I10 Essential (primary) hypertension: Secondary | ICD-10-CM | POA: Diagnosis not present

## 2021-05-09 DIAGNOSIS — E785 Hyperlipidemia, unspecified: Secondary | ICD-10-CM | POA: Diagnosis not present

## 2021-05-31 DIAGNOSIS — M79604 Pain in right leg: Secondary | ICD-10-CM | POA: Diagnosis not present

## 2021-05-31 DIAGNOSIS — I10 Essential (primary) hypertension: Secondary | ICD-10-CM | POA: Diagnosis not present

## 2021-05-31 DIAGNOSIS — M79605 Pain in left leg: Secondary | ICD-10-CM | POA: Diagnosis not present

## 2021-06-05 DIAGNOSIS — M47812 Spondylosis without myelopathy or radiculopathy, cervical region: Secondary | ICD-10-CM | POA: Diagnosis not present

## 2021-06-05 DIAGNOSIS — G4700 Insomnia, unspecified: Secondary | ICD-10-CM | POA: Diagnosis not present

## 2021-06-05 DIAGNOSIS — M5412 Radiculopathy, cervical region: Secondary | ICD-10-CM | POA: Diagnosis not present

## 2021-06-05 DIAGNOSIS — G894 Chronic pain syndrome: Secondary | ICD-10-CM | POA: Diagnosis not present

## 2021-06-09 DIAGNOSIS — G894 Chronic pain syndrome: Secondary | ICD-10-CM | POA: Diagnosis not present

## 2021-06-25 DIAGNOSIS — R7303 Prediabetes: Secondary | ICD-10-CM | POA: Diagnosis not present

## 2021-06-25 DIAGNOSIS — E785 Hyperlipidemia, unspecified: Secondary | ICD-10-CM | POA: Diagnosis not present

## 2021-06-25 DIAGNOSIS — I10 Essential (primary) hypertension: Secondary | ICD-10-CM | POA: Diagnosis not present

## 2021-07-02 DIAGNOSIS — E785 Hyperlipidemia, unspecified: Secondary | ICD-10-CM | POA: Diagnosis not present

## 2021-07-02 DIAGNOSIS — Z136 Encounter for screening for cardiovascular disorders: Secondary | ICD-10-CM | POA: Diagnosis not present

## 2021-07-02 DIAGNOSIS — Z0000 Encounter for general adult medical examination without abnormal findings: Secondary | ICD-10-CM | POA: Diagnosis not present

## 2021-07-02 DIAGNOSIS — Z7189 Other specified counseling: Secondary | ICD-10-CM | POA: Diagnosis not present

## 2021-07-02 DIAGNOSIS — Z139 Encounter for screening, unspecified: Secondary | ICD-10-CM | POA: Diagnosis not present

## 2021-07-02 DIAGNOSIS — I10 Essential (primary) hypertension: Secondary | ICD-10-CM | POA: Diagnosis not present

## 2021-07-03 ENCOUNTER — Other Ambulatory Visit: Payer: Self-pay | Admitting: Family Medicine

## 2021-07-03 DIAGNOSIS — Z1231 Encounter for screening mammogram for malignant neoplasm of breast: Secondary | ICD-10-CM

## 2021-07-04 DIAGNOSIS — G894 Chronic pain syndrome: Secondary | ICD-10-CM | POA: Diagnosis not present

## 2021-07-04 DIAGNOSIS — M47812 Spondylosis without myelopathy or radiculopathy, cervical region: Secondary | ICD-10-CM | POA: Diagnosis not present

## 2021-07-04 DIAGNOSIS — G4700 Insomnia, unspecified: Secondary | ICD-10-CM | POA: Diagnosis not present

## 2021-07-04 DIAGNOSIS — M5412 Radiculopathy, cervical region: Secondary | ICD-10-CM | POA: Diagnosis not present

## 2021-07-09 DIAGNOSIS — G894 Chronic pain syndrome: Secondary | ICD-10-CM | POA: Diagnosis not present

## 2021-07-30 ENCOUNTER — Ambulatory Visit
Admission: RE | Admit: 2021-07-30 | Discharge: 2021-07-30 | Disposition: A | Discharge status: Home / Self Care | Payer: Medicare Other | Source: Ambulatory Visit | Attending: Family Medicine | Admitting: Family Medicine

## 2021-07-30 DIAGNOSIS — Z1231 Encounter for screening mammogram for malignant neoplasm of breast: Secondary | ICD-10-CM | POA: Diagnosis not present

## 2021-08-01 DIAGNOSIS — G4700 Insomnia, unspecified: Secondary | ICD-10-CM | POA: Diagnosis not present

## 2021-08-01 DIAGNOSIS — G894 Chronic pain syndrome: Secondary | ICD-10-CM | POA: Diagnosis not present

## 2021-08-01 DIAGNOSIS — M5412 Radiculopathy, cervical region: Secondary | ICD-10-CM | POA: Diagnosis not present

## 2021-08-01 DIAGNOSIS — M47812 Spondylosis without myelopathy or radiculopathy, cervical region: Secondary | ICD-10-CM | POA: Diagnosis not present

## 2021-08-09 DIAGNOSIS — G894 Chronic pain syndrome: Secondary | ICD-10-CM | POA: Diagnosis not present

## 2021-08-15 DIAGNOSIS — E119 Type 2 diabetes mellitus without complications: Secondary | ICD-10-CM | POA: Diagnosis not present

## 2021-08-16 DIAGNOSIS — D1801 Hemangioma of skin and subcutaneous tissue: Secondary | ICD-10-CM | POA: Diagnosis not present

## 2021-08-16 DIAGNOSIS — D485 Neoplasm of uncertain behavior of skin: Secondary | ICD-10-CM | POA: Diagnosis not present

## 2021-09-03 DIAGNOSIS — G894 Chronic pain syndrome: Secondary | ICD-10-CM | POA: Diagnosis not present

## 2021-09-03 DIAGNOSIS — G4700 Insomnia, unspecified: Secondary | ICD-10-CM | POA: Diagnosis not present

## 2021-09-03 DIAGNOSIS — M47812 Spondylosis without myelopathy or radiculopathy, cervical region: Secondary | ICD-10-CM | POA: Diagnosis not present

## 2021-09-03 DIAGNOSIS — M5412 Radiculopathy, cervical region: Secondary | ICD-10-CM | POA: Diagnosis not present

## 2021-09-08 DIAGNOSIS — E785 Hyperlipidemia, unspecified: Secondary | ICD-10-CM | POA: Diagnosis not present

## 2021-09-08 DIAGNOSIS — G894 Chronic pain syndrome: Secondary | ICD-10-CM | POA: Diagnosis not present

## 2021-09-25 DIAGNOSIS — R0602 Shortness of breath: Secondary | ICD-10-CM | POA: Diagnosis not present

## 2021-09-25 DIAGNOSIS — Z1231 Encounter for screening mammogram for malignant neoplasm of breast: Secondary | ICD-10-CM | POA: Diagnosis not present

## 2021-09-25 DIAGNOSIS — R0609 Other forms of dyspnea: Secondary | ICD-10-CM | POA: Diagnosis not present

## 2021-09-25 DIAGNOSIS — R062 Wheezing: Secondary | ICD-10-CM | POA: Diagnosis not present

## 2021-09-27 DIAGNOSIS — R0602 Shortness of breath: Secondary | ICD-10-CM | POA: Diagnosis not present

## 2021-09-27 DIAGNOSIS — R0609 Other forms of dyspnea: Secondary | ICD-10-CM | POA: Diagnosis not present

## 2021-09-27 DIAGNOSIS — R079 Chest pain, unspecified: Secondary | ICD-10-CM | POA: Diagnosis not present

## 2021-09-27 DIAGNOSIS — R7989 Other specified abnormal findings of blood chemistry: Secondary | ICD-10-CM | POA: Diagnosis not present

## 2021-10-01 DIAGNOSIS — I081 Rheumatic disorders of both mitral and tricuspid valves: Secondary | ICD-10-CM | POA: Diagnosis not present

## 2021-10-01 DIAGNOSIS — R0609 Other forms of dyspnea: Secondary | ICD-10-CM | POA: Diagnosis not present

## 2021-10-02 DIAGNOSIS — G4700 Insomnia, unspecified: Secondary | ICD-10-CM | POA: Diagnosis not present

## 2021-10-02 DIAGNOSIS — M47812 Spondylosis without myelopathy or radiculopathy, cervical region: Secondary | ICD-10-CM | POA: Diagnosis not present

## 2021-10-02 DIAGNOSIS — G894 Chronic pain syndrome: Secondary | ICD-10-CM | POA: Diagnosis not present

## 2021-10-02 DIAGNOSIS — M5412 Radiculopathy, cervical region: Secondary | ICD-10-CM | POA: Diagnosis not present

## 2021-10-09 DIAGNOSIS — I10 Essential (primary) hypertension: Secondary | ICD-10-CM | POA: Diagnosis not present

## 2021-10-19 DIAGNOSIS — E785 Hyperlipidemia, unspecified: Secondary | ICD-10-CM | POA: Diagnosis not present

## 2021-10-19 DIAGNOSIS — I10 Essential (primary) hypertension: Secondary | ICD-10-CM | POA: Diagnosis not present

## 2021-10-19 DIAGNOSIS — R7303 Prediabetes: Secondary | ICD-10-CM | POA: Diagnosis not present

## 2021-10-24 HISTORY — PX: DILATION AND CURETTAGE OF UTERUS: SHX78

## 2021-10-30 DIAGNOSIS — R9389 Abnormal findings on diagnostic imaging of other specified body structures: Secondary | ICD-10-CM | POA: Diagnosis not present

## 2021-11-06 DIAGNOSIS — I10 Essential (primary) hypertension: Secondary | ICD-10-CM | POA: Diagnosis not present

## 2021-11-06 DIAGNOSIS — Z1231 Encounter for screening mammogram for malignant neoplasm of breast: Secondary | ICD-10-CM | POA: Diagnosis not present

## 2021-11-06 DIAGNOSIS — R7303 Prediabetes: Secondary | ICD-10-CM | POA: Diagnosis not present

## 2021-11-06 DIAGNOSIS — E785 Hyperlipidemia, unspecified: Secondary | ICD-10-CM | POA: Diagnosis not present

## 2021-11-06 DIAGNOSIS — Z23 Encounter for immunization: Secondary | ICD-10-CM | POA: Diagnosis not present

## 2021-11-08 DIAGNOSIS — M47812 Spondylosis without myelopathy or radiculopathy, cervical region: Secondary | ICD-10-CM | POA: Diagnosis not present

## 2021-11-08 DIAGNOSIS — G4700 Insomnia, unspecified: Secondary | ICD-10-CM | POA: Diagnosis not present

## 2021-11-08 DIAGNOSIS — G894 Chronic pain syndrome: Secondary | ICD-10-CM | POA: Diagnosis not present

## 2021-11-08 DIAGNOSIS — M5412 Radiculopathy, cervical region: Secondary | ICD-10-CM | POA: Diagnosis not present

## 2021-11-09 DIAGNOSIS — I10 Essential (primary) hypertension: Secondary | ICD-10-CM | POA: Diagnosis not present

## 2021-11-13 DIAGNOSIS — R0683 Snoring: Secondary | ICD-10-CM | POA: Diagnosis not present

## 2021-11-13 DIAGNOSIS — I10 Essential (primary) hypertension: Secondary | ICD-10-CM | POA: Diagnosis not present

## 2021-11-13 DIAGNOSIS — E785 Hyperlipidemia, unspecified: Secondary | ICD-10-CM | POA: Diagnosis not present

## 2021-11-15 DIAGNOSIS — I10 Essential (primary) hypertension: Secondary | ICD-10-CM | POA: Diagnosis not present

## 2021-11-15 DIAGNOSIS — Z79891 Long term (current) use of opiate analgesic: Secondary | ICD-10-CM | POA: Diagnosis not present

## 2021-11-15 DIAGNOSIS — R9389 Abnormal findings on diagnostic imaging of other specified body structures: Secondary | ICD-10-CM | POA: Diagnosis not present

## 2021-11-15 DIAGNOSIS — K760 Fatty (change of) liver, not elsewhere classified: Secondary | ICD-10-CM | POA: Diagnosis not present

## 2021-11-15 DIAGNOSIS — Z79899 Other long term (current) drug therapy: Secondary | ICD-10-CM | POA: Diagnosis not present

## 2021-11-15 DIAGNOSIS — E785 Hyperlipidemia, unspecified: Secondary | ICD-10-CM | POA: Diagnosis not present

## 2021-11-20 ENCOUNTER — Encounter: Payer: Self-pay | Admitting: Gastroenterology

## 2021-11-22 DIAGNOSIS — Q742 Other congenital malformations of lower limb(s), including pelvic girdle: Secondary | ICD-10-CM | POA: Diagnosis not present

## 2021-11-26 DIAGNOSIS — Z23 Encounter for immunization: Secondary | ICD-10-CM | POA: Diagnosis not present

## 2021-11-26 DIAGNOSIS — G4710 Hypersomnia, unspecified: Secondary | ICD-10-CM | POA: Diagnosis not present

## 2021-12-06 DIAGNOSIS — G4733 Obstructive sleep apnea (adult) (pediatric): Secondary | ICD-10-CM | POA: Diagnosis not present

## 2021-12-09 DIAGNOSIS — I10 Essential (primary) hypertension: Secondary | ICD-10-CM | POA: Diagnosis not present

## 2021-12-17 DIAGNOSIS — Z723 Lack of physical exercise: Secondary | ICD-10-CM | POA: Diagnosis not present

## 2021-12-17 DIAGNOSIS — M6281 Muscle weakness (generalized): Secondary | ICD-10-CM | POA: Diagnosis not present

## 2021-12-27 DIAGNOSIS — Z713 Dietary counseling and surveillance: Secondary | ICD-10-CM | POA: Diagnosis not present

## 2022-01-03 DIAGNOSIS — M4722 Other spondylosis with radiculopathy, cervical region: Secondary | ICD-10-CM | POA: Diagnosis not present

## 2022-01-03 DIAGNOSIS — G894 Chronic pain syndrome: Secondary | ICD-10-CM | POA: Diagnosis not present

## 2022-01-03 DIAGNOSIS — G4700 Insomnia, unspecified: Secondary | ICD-10-CM | POA: Diagnosis not present

## 2022-01-09 DIAGNOSIS — I10 Essential (primary) hypertension: Secondary | ICD-10-CM | POA: Diagnosis not present

## 2022-01-18 DIAGNOSIS — Z0000 Encounter for general adult medical examination without abnormal findings: Secondary | ICD-10-CM | POA: Diagnosis not present

## 2022-01-18 DIAGNOSIS — Q7243 Longitudinal reduction defect of femur, bilateral: Secondary | ICD-10-CM | POA: Diagnosis not present

## 2022-01-18 DIAGNOSIS — Z23 Encounter for immunization: Secondary | ICD-10-CM | POA: Diagnosis not present

## 2022-01-18 DIAGNOSIS — E559 Vitamin D deficiency, unspecified: Secondary | ICD-10-CM | POA: Diagnosis not present

## 2022-01-18 DIAGNOSIS — Z79899 Other long term (current) drug therapy: Secondary | ICD-10-CM | POA: Diagnosis not present

## 2022-01-18 DIAGNOSIS — Z136 Encounter for screening for cardiovascular disorders: Secondary | ICD-10-CM | POA: Diagnosis not present

## 2022-01-18 DIAGNOSIS — G8929 Other chronic pain: Secondary | ICD-10-CM | POA: Diagnosis not present

## 2022-01-18 DIAGNOSIS — I10 Essential (primary) hypertension: Secondary | ICD-10-CM | POA: Diagnosis not present

## 2022-01-18 DIAGNOSIS — Z7189 Other specified counseling: Secondary | ICD-10-CM | POA: Diagnosis not present

## 2022-01-23 ENCOUNTER — Ambulatory Visit (INDEPENDENT_AMBULATORY_CARE_PROVIDER_SITE_OTHER): Payer: Medicare Other | Admitting: Gastroenterology

## 2022-01-23 ENCOUNTER — Encounter: Payer: Self-pay | Admitting: Gastroenterology

## 2022-01-23 VITALS — BP 118/74 | HR 84 | Ht <= 58 in | Wt 224.4 lb

## 2022-01-23 DIAGNOSIS — Z800 Family history of malignant neoplasm of digestive organs: Secondary | ICD-10-CM | POA: Diagnosis not present

## 2022-01-23 DIAGNOSIS — R1013 Epigastric pain: Secondary | ICD-10-CM

## 2022-01-23 NOTE — Progress Notes (Addendum)
Chief Complaint: For GI eval  Referring Provider:  Marco Collie, MD      ASSESSMENT AND PLAN;   #1. FH CRC (dad at age 54)  #2. Pre-op for Bariatric Sx  Plan: -EGD/Colon at [facility] (due to high BMI)   I discussed EGD/Colonoscopy- the indications, risks, alternatives and potential complications including, but not limited to bleeding, infection, reaction to meds, damage to internal organs, cardiac and/or pulmonary problems, and perforation requiring surgery. The possibility that significant findings could be missed was explained. All ? were answered. Pt consents to proceed.  EGD is for epi tenderness/epigastric pain.  HPI:    Dana Hines is a 56 y.o. female  B/L femoral congenital agenesis, HLD, HTN, morbid obesity, anxiety  No nausea, vomiting, heartburn, regurgitation, odynophagia or dysphagia.  No significant diarrhea or constipation.  No melena or hematochezia. No unintentional weight loss. No abdominal pain.  Occ Diarrhea with milk and " greasy foods"  Seen Dr Evorn Gong for bariatric Sx, 05/2022. Per his note, needs preop EGD.  Prev colon in [place] over 10 yrs ago- neg.  Past Medical History:  Diagnosis Date   Anxiety    Arthritis    Blood transfusion without reported diagnosis    2014   Diabetes mellitus without complication ([place])    takes Ozempic for weight control, no hx of diabetes   Hyperlipidemia    Hypertension     Past Surgical History:  Procedure Laterality Date   CESAREAN SECTION     COLONOSCOPY     DILATION AND CURETTAGE OF UTERUS  10/24/2021   kidney repair     intesinal repair 2014    Family History  Problem Relation Age of Onset   Colon cancer Father        dx in his 82's   Esophageal cancer Neg Hx    Rectal cancer Neg Hx    Stomach cancer Neg Hx     Social History   Tobacco Use   Smoking status: Never   Smokeless tobacco: Never  Vaping Use   Vaping Use: Never used  Substance Use Topics   Alcohol use: Not Currently   Drug use:  Never    Current Outpatient Medications  Medication Sig Dispense Refill   amLODipine (NORVASC) 5 MG tablet Take 1 tablet by mouth daily.     atorvastatin (LIPITOR) 10 MG tablet Take 1 tablet by mouth daily.     citalopram (CELEXA) 40 MG tablet Take 40 mg by mouth daily.     gabapentin (NEURONTIN) 300 MG capsule Take by mouth 4 (four) times daily.     lisinopril (ZESTRIL) 10 MG tablet Take 1 tablet by mouth daily.     olmesartan (BENICAR) 20 MG tablet Take by mouth daily.     oxyCODONE-acetaminophen (PERCOCET) 10-325 MG tablet Take 1 tablet by mouth 4 (four) times daily as needed.     tiZANidine (ZANAFLEX) 2 MG tablet Take 2-4 mg by mouth 3 (three) times daily as needed.     oxyCODONE-acetaminophen (PERCOCET) 10-325 MG tablet once a week. (Patient not taking: Reported on 01/23/2022)     No current facility-administered medications for this visit.    Allergies  Allergen Reactions   Morphine And Related     15 years ago- itching, hives    Review of Systems:  Constitutional: Denies fever, chills, diaphoresis, appetite change and fatigue.  HEENT: Denies photophobia, eye pain, redness, hearing loss, ear pain, congestion, sore throat, rhinorrhea, sneezing, mouth sores, neck pain, neck stiffness  and tinnitus.   Respiratory: Denies SOB, DOE, cough, chest tightness,  and wheezing.   Cardiovascular: Denies chest pain, palpitations and leg swelling.  Genitourinary: Denies dysuria, urgency, frequency, hematuria, flank pain and difficulty urinating.  Musculoskeletal: has myalgias, back pain, joint swelling, arthralgias and gait problem.  Skin: No rash.  Neurological: Denies dizziness, seizures, syncope, weakness, light-headedness, numbness and headaches.  Hematological: Denies adenopathy. Easy bruising, personal or family bleeding history  Psychiatric/Behavioral: Has  anxiety or depression     Physical Exam:    BP 118/74   Pulse 84   Ht '3\' 4"'$  (1.016 m)   Wt 224 lb 6.4 oz (101.8 kg)    SpO2 97%   BMI 98.61 kg/m  Wt Readings from Last 3 Encounters:  01/23/22 224 lb 6.4 oz (101.8 kg)  07/10/20 219 lb 12.8 oz (99.7 kg)   Constitutional:  Well-developed, in no acute distress. Psychiatric: Normal mood and affect. Behavior is normal. HEENT: Pupils normal.  Conjunctivae are normal. No scleral icterus. Cardiovascular: Normal rate, regular rhythm. No edema Pulmonary/chest: Effort normal and breath sounds normal. No wheezing, rales or rhonchi. Abdominal: Soft, nondistended. Epi tender. Bowel sounds active throughout. There are no masses palpable. No hepatomegaly. Rectal: Deferred Neurological: Alert and oriented to person place and time. Skin: Skin is warm and dry. No rashes noted.    Carmell Austria, MD 01/23/2022, 9:44 AM  Cc: Marco Collie, MD

## 2022-01-30 DIAGNOSIS — G4700 Insomnia, unspecified: Secondary | ICD-10-CM | POA: Diagnosis not present

## 2022-01-30 DIAGNOSIS — G4733 Obstructive sleep apnea (adult) (pediatric): Secondary | ICD-10-CM | POA: Diagnosis not present

## 2022-01-30 DIAGNOSIS — M5412 Radiculopathy, cervical region: Secondary | ICD-10-CM | POA: Diagnosis not present

## 2022-01-30 DIAGNOSIS — G4710 Hypersomnia, unspecified: Secondary | ICD-10-CM | POA: Diagnosis not present

## 2022-01-30 DIAGNOSIS — G894 Chronic pain syndrome: Secondary | ICD-10-CM | POA: Diagnosis not present

## 2022-01-30 DIAGNOSIS — M47812 Spondylosis without myelopathy or radiculopathy, cervical region: Secondary | ICD-10-CM | POA: Diagnosis not present

## 2022-02-04 NOTE — Addendum Note (Signed)
Addended by: Curlene Labrum E on: 02/04/2022 03:33 PM   Modules accepted: Orders

## 2022-02-05 ENCOUNTER — Telehealth: Payer: Self-pay

## 2022-02-05 NOTE — Telephone Encounter (Signed)
Patient instructions mailed and will call her December 12th to go over the instructions

## 2022-02-05 NOTE — Addendum Note (Signed)
Addended by: Curlene Labrum E on: 02/05/2022 09:43 AM   Modules accepted: Orders

## 2022-02-06 DIAGNOSIS — Z713 Dietary counseling and surveillance: Secondary | ICD-10-CM | POA: Diagnosis not present

## 2022-02-08 DIAGNOSIS — I10 Essential (primary) hypertension: Secondary | ICD-10-CM | POA: Diagnosis not present

## 2022-02-11 DIAGNOSIS — I10 Essential (primary) hypertension: Secondary | ICD-10-CM | POA: Diagnosis not present

## 2022-02-11 DIAGNOSIS — R7303 Prediabetes: Secondary | ICD-10-CM | POA: Diagnosis not present

## 2022-02-11 DIAGNOSIS — E785 Hyperlipidemia, unspecified: Secondary | ICD-10-CM | POA: Diagnosis not present

## 2022-02-14 DIAGNOSIS — R5383 Other fatigue: Secondary | ICD-10-CM | POA: Diagnosis not present

## 2022-02-14 DIAGNOSIS — Z136 Encounter for screening for cardiovascular disorders: Secondary | ICD-10-CM | POA: Diagnosis not present

## 2022-02-14 DIAGNOSIS — Z01818 Encounter for other preprocedural examination: Secondary | ICD-10-CM | POA: Diagnosis not present

## 2022-02-14 DIAGNOSIS — Z131 Encounter for screening for diabetes mellitus: Secondary | ICD-10-CM | POA: Diagnosis not present

## 2022-02-14 DIAGNOSIS — R7309 Other abnormal glucose: Secondary | ICD-10-CM | POA: Diagnosis not present

## 2022-02-14 DIAGNOSIS — E559 Vitamin D deficiency, unspecified: Secondary | ICD-10-CM | POA: Diagnosis not present

## 2022-02-14 DIAGNOSIS — E785 Hyperlipidemia, unspecified: Secondary | ICD-10-CM | POA: Diagnosis not present

## 2022-02-14 DIAGNOSIS — G4733 Obstructive sleep apnea (adult) (pediatric): Secondary | ICD-10-CM | POA: Diagnosis not present

## 2022-02-14 DIAGNOSIS — M2550 Pain in unspecified joint: Secondary | ICD-10-CM | POA: Diagnosis not present

## 2022-02-14 DIAGNOSIS — I10 Essential (primary) hypertension: Secondary | ICD-10-CM | POA: Diagnosis not present

## 2022-02-14 DIAGNOSIS — J069 Acute upper respiratory infection, unspecified: Secondary | ICD-10-CM | POA: Diagnosis not present

## 2022-02-20 NOTE — Telephone Encounter (Signed)
Patient haven't  received letter yet. Will call tomorrow afternoon. She will check with her carrier today because she doesn't have good mail lady

## 2022-02-21 NOTE — Telephone Encounter (Signed)
Will call 12-21 around lunch time. She has her instructions. She will look over them

## 2022-02-27 DIAGNOSIS — G4700 Insomnia, unspecified: Secondary | ICD-10-CM | POA: Diagnosis not present

## 2022-02-27 DIAGNOSIS — M47812 Spondylosis without myelopathy or radiculopathy, cervical region: Secondary | ICD-10-CM | POA: Diagnosis not present

## 2022-02-27 DIAGNOSIS — M5412 Radiculopathy, cervical region: Secondary | ICD-10-CM | POA: Diagnosis not present

## 2022-02-27 DIAGNOSIS — G894 Chronic pain syndrome: Secondary | ICD-10-CM | POA: Diagnosis not present

## 2022-03-05 NOTE — Telephone Encounter (Signed)
Patient instructed and she voiced understanding. Will call back with any questions

## 2022-03-11 DIAGNOSIS — I10 Essential (primary) hypertension: Secondary | ICD-10-CM | POA: Diagnosis not present

## 2022-03-12 DIAGNOSIS — G4733 Obstructive sleep apnea (adult) (pediatric): Secondary | ICD-10-CM | POA: Diagnosis not present

## 2022-03-20 DIAGNOSIS — E785 Hyperlipidemia, unspecified: Secondary | ICD-10-CM | POA: Diagnosis not present

## 2022-03-20 DIAGNOSIS — E559 Vitamin D deficiency, unspecified: Secondary | ICD-10-CM | POA: Diagnosis not present

## 2022-03-20 DIAGNOSIS — R7303 Prediabetes: Secondary | ICD-10-CM | POA: Diagnosis not present

## 2022-03-20 DIAGNOSIS — I10 Essential (primary) hypertension: Secondary | ICD-10-CM | POA: Diagnosis not present

## 2022-03-20 DIAGNOSIS — G4733 Obstructive sleep apnea (adult) (pediatric): Secondary | ICD-10-CM | POA: Diagnosis not present

## 2022-03-28 ENCOUNTER — Other Ambulatory Visit (HOSPITAL_COMMUNITY): Payer: Self-pay

## 2022-03-28 DIAGNOSIS — M47812 Spondylosis without myelopathy or radiculopathy, cervical region: Secondary | ICD-10-CM | POA: Diagnosis not present

## 2022-03-28 DIAGNOSIS — M5412 Radiculopathy, cervical region: Secondary | ICD-10-CM | POA: Diagnosis not present

## 2022-03-28 DIAGNOSIS — G894 Chronic pain syndrome: Secondary | ICD-10-CM | POA: Diagnosis not present

## 2022-03-28 DIAGNOSIS — G4700 Insomnia, unspecified: Secondary | ICD-10-CM | POA: Diagnosis not present

## 2022-03-28 MED ORDER — OXYCODONE HCL 10 MG PO TABS
10.0000 mg | ORAL_TABLET | Freq: Four times a day (QID) | ORAL | 0 refills | Status: DC | PRN
  Filled 2022-03-28 – 2022-03-29 (×2): qty 120, 30d supply, fill #0

## 2022-03-29 ENCOUNTER — Other Ambulatory Visit (HOSPITAL_COMMUNITY): Payer: Self-pay

## 2022-04-09 ENCOUNTER — Other Ambulatory Visit (HOSPITAL_COMMUNITY): Payer: Self-pay

## 2022-04-09 MED ORDER — OSELTAMIVIR PHOSPHATE 75 MG PO CAPS
75.0000 mg | ORAL_CAPSULE | Freq: Every day | ORAL | 0 refills | Status: AC
  Filled 2022-04-09: qty 10, 10d supply, fill #0

## 2022-04-10 ENCOUNTER — Other Ambulatory Visit (HOSPITAL_COMMUNITY): Payer: Self-pay

## 2022-04-11 DIAGNOSIS — I10 Essential (primary) hypertension: Secondary | ICD-10-CM | POA: Diagnosis not present

## 2022-04-12 DIAGNOSIS — R7303 Prediabetes: Secondary | ICD-10-CM | POA: Diagnosis not present

## 2022-04-12 DIAGNOSIS — G4733 Obstructive sleep apnea (adult) (pediatric): Secondary | ICD-10-CM | POA: Diagnosis not present

## 2022-04-12 DIAGNOSIS — Z01818 Encounter for other preprocedural examination: Secondary | ICD-10-CM | POA: Diagnosis not present

## 2022-04-12 DIAGNOSIS — E785 Hyperlipidemia, unspecified: Secondary | ICD-10-CM | POA: Diagnosis not present

## 2022-04-12 DIAGNOSIS — G4730 Sleep apnea, unspecified: Secondary | ICD-10-CM | POA: Diagnosis not present

## 2022-04-12 DIAGNOSIS — I10 Essential (primary) hypertension: Secondary | ICD-10-CM | POA: Diagnosis not present

## 2022-04-29 ENCOUNTER — Other Ambulatory Visit: Payer: Self-pay

## 2022-04-29 ENCOUNTER — Other Ambulatory Visit (HOSPITAL_COMMUNITY): Payer: Self-pay

## 2022-04-29 ENCOUNTER — Telehealth: Payer: Self-pay | Admitting: Gastroenterology

## 2022-04-29 DIAGNOSIS — G894 Chronic pain syndrome: Secondary | ICD-10-CM | POA: Diagnosis not present

## 2022-04-29 DIAGNOSIS — M5412 Radiculopathy, cervical region: Secondary | ICD-10-CM | POA: Diagnosis not present

## 2022-04-29 DIAGNOSIS — G4700 Insomnia, unspecified: Secondary | ICD-10-CM | POA: Diagnosis not present

## 2022-04-29 DIAGNOSIS — M47812 Spondylosis without myelopathy or radiculopathy, cervical region: Secondary | ICD-10-CM | POA: Diagnosis not present

## 2022-04-29 MED ORDER — OXYCODONE HCL 10 MG PO TABS
10.0000 mg | ORAL_TABLET | Freq: Four times a day (QID) | ORAL | 0 refills | Status: DC | PRN
  Filled 2022-04-29: qty 120, 30d supply, fill #0

## 2022-04-29 MED ORDER — GABAPENTIN 300 MG PO CAPS
600.0000 mg | ORAL_CAPSULE | Freq: Four times a day (QID) | ORAL | 0 refills | Status: DC
  Filled 2022-04-29: qty 720, 90d supply, fill #0

## 2022-04-29 MED ORDER — TIZANIDINE HCL 2 MG PO TABS
2.0000 mg | ORAL_TABLET | Freq: Three times a day (TID) | ORAL | 0 refills | Status: DC | PRN
  Filled 2022-04-29 (×2): qty 540, 90d supply, fill #0

## 2022-04-29 NOTE — Telephone Encounter (Signed)
Left message for pt to call back  °

## 2022-04-29 NOTE — Telephone Encounter (Signed)
Pt stated that she has instructions about her prep. Prep instructions was reviewed with pt in detail. Pt verbalized understanding with all questions answered.

## 2022-04-29 NOTE — Telephone Encounter (Signed)
Patient returned call

## 2022-04-29 NOTE — Telephone Encounter (Signed)
PT is scheduled for colonoscopy 2/22 and has prep questions. Wants to know is she to have 2 bottles of Miralax because it has drinking a mixture on 2/20 and then a full bottle mixture 2/21. Please advise.

## 2022-05-01 ENCOUNTER — Other Ambulatory Visit (HOSPITAL_COMMUNITY): Payer: Self-pay

## 2022-05-01 MED ORDER — APREPITANT 40 MG PO CAPS
ORAL_CAPSULE | ORAL | 0 refills | Status: AC
  Filled 2022-05-01: qty 1, 1d supply, fill #0

## 2022-05-01 MED ORDER — LEVOFLOXACIN 500 MG PO TABS
ORAL_TABLET | ORAL | 0 refills | Status: AC
  Filled 2022-05-01: qty 1, 1d supply, fill #0

## 2022-05-01 MED ORDER — URSODIOL 300 MG PO CAPS
ORAL_CAPSULE | ORAL | 5 refills | Status: AC
  Filled 2022-05-01: qty 60, 30d supply, fill #0

## 2022-05-01 MED ORDER — METOCLOPRAMIDE HCL 10 MG PO TABS
ORAL_TABLET | ORAL | 0 refills | Status: AC
  Filled 2022-05-01: qty 1, 1d supply, fill #0

## 2022-05-01 MED ORDER — ONDANSETRON 8 MG PO TBDP
ORAL_TABLET | ORAL | 1 refills | Status: AC
  Filled 2022-05-01: qty 30, 15d supply, fill #0

## 2022-05-01 MED ORDER — OMEPRAZOLE 40 MG PO CPDR
DELAYED_RELEASE_CAPSULE | ORAL | 5 refills | Status: DC
  Filled 2022-05-01: qty 30, 30d supply, fill #0

## 2022-05-02 ENCOUNTER — Ambulatory Visit (HOSPITAL_BASED_OUTPATIENT_CLINIC_OR_DEPARTMENT_OTHER): Payer: 59 | Admitting: Certified Registered Nurse Anesthetist

## 2022-05-02 ENCOUNTER — Encounter (HOSPITAL_COMMUNITY): Payer: Self-pay | Admitting: Gastroenterology

## 2022-05-02 ENCOUNTER — Encounter (HOSPITAL_COMMUNITY)
Admission: RE | Disposition: A | Discharge status: Home / Self Care | Payer: Self-pay | Source: Home / Self Care | Attending: Gastroenterology

## 2022-05-02 ENCOUNTER — Ambulatory Visit (HOSPITAL_COMMUNITY)
Admission: RE | Admit: 2022-05-02 | Discharge: 2022-05-02 | Disposition: A | Discharge status: Home / Self Care | Payer: 59 | Attending: Gastroenterology | Admitting: Gastroenterology

## 2022-05-02 ENCOUNTER — Other Ambulatory Visit (HOSPITAL_COMMUNITY): Payer: Self-pay

## 2022-05-02 ENCOUNTER — Ambulatory Visit (HOSPITAL_COMMUNITY): Payer: 59 | Admitting: Certified Registered Nurse Anesthetist

## 2022-05-02 DIAGNOSIS — E119 Type 2 diabetes mellitus without complications: Secondary | ICD-10-CM

## 2022-05-02 DIAGNOSIS — K2970 Gastritis, unspecified, without bleeding: Secondary | ICD-10-CM | POA: Insufficient documentation

## 2022-05-02 DIAGNOSIS — Z01818 Encounter for other preprocedural examination: Secondary | ICD-10-CM | POA: Insufficient documentation

## 2022-05-02 DIAGNOSIS — Z6841 Body Mass Index (BMI) 40.0 and over, adult: Secondary | ICD-10-CM | POA: Diagnosis not present

## 2022-05-02 DIAGNOSIS — K319 Disease of stomach and duodenum, unspecified: Secondary | ICD-10-CM | POA: Diagnosis not present

## 2022-05-02 DIAGNOSIS — Z79899 Other long term (current) drug therapy: Secondary | ICD-10-CM | POA: Diagnosis not present

## 2022-05-02 DIAGNOSIS — Z1211 Encounter for screening for malignant neoplasm of colon: Secondary | ICD-10-CM | POA: Diagnosis not present

## 2022-05-02 DIAGNOSIS — E785 Hyperlipidemia, unspecified: Secondary | ICD-10-CM | POA: Insufficient documentation

## 2022-05-02 DIAGNOSIS — K640 First degree hemorrhoids: Secondary | ICD-10-CM | POA: Diagnosis not present

## 2022-05-02 DIAGNOSIS — Z800 Family history of malignant neoplasm of digestive organs: Secondary | ICD-10-CM

## 2022-05-02 DIAGNOSIS — K2100 Gastro-esophageal reflux disease with esophagitis, without bleeding: Secondary | ICD-10-CM | POA: Diagnosis not present

## 2022-05-02 DIAGNOSIS — D125 Benign neoplasm of sigmoid colon: Secondary | ICD-10-CM

## 2022-05-02 DIAGNOSIS — M1990 Unspecified osteoarthritis, unspecified site: Secondary | ICD-10-CM | POA: Insufficient documentation

## 2022-05-02 DIAGNOSIS — K219 Gastro-esophageal reflux disease without esophagitis: Secondary | ICD-10-CM | POA: Diagnosis not present

## 2022-05-02 DIAGNOSIS — I10 Essential (primary) hypertension: Secondary | ICD-10-CM

## 2022-05-02 DIAGNOSIS — F419 Anxiety disorder, unspecified: Secondary | ICD-10-CM | POA: Insufficient documentation

## 2022-05-02 HISTORY — PX: BIOPSY: SHX5522

## 2022-05-02 HISTORY — PX: ESOPHAGOGASTRODUODENOSCOPY (EGD) WITH PROPOFOL: SHX5813

## 2022-05-02 HISTORY — PX: COLONOSCOPY WITH PROPOFOL: SHX5780

## 2022-05-02 HISTORY — PX: POLYPECTOMY: SHX5525

## 2022-05-02 LAB — GLUCOSE, CAPILLARY
Glucose-Capillary: 115 mg/dL — ABNORMAL HIGH (ref 70–99)
Glucose-Capillary: 113 mg/dL — ABNORMAL HIGH (ref 70–99)

## 2022-05-02 SURGERY — COLONOSCOPY WITH PROPOFOL
Anesthesia: Monitor Anesthesia Care

## 2022-05-02 MED ORDER — PROPOFOL 10 MG/ML IV BOLUS
INTRAVENOUS | Status: DC | PRN
  Administered 2022-05-02: 30 mg via INTRAVENOUS
  Administered 2022-05-02: 100 mg via INTRAVENOUS

## 2022-05-02 MED ORDER — PROPOFOL 500 MG/50ML IV EMUL
INTRAVENOUS | Status: DC | PRN
  Administered 2022-05-02: 150 ug/kg/min via INTRAVENOUS

## 2022-05-02 MED ORDER — LIDOCAINE 2% (20 MG/ML) 5 ML SYRINGE
INTRAMUSCULAR | Status: DC | PRN
  Administered 2022-05-02: 60 mg via INTRAVENOUS

## 2022-05-02 MED ORDER — SODIUM CHLORIDE 0.9 % IV SOLN: INTRAVENOUS | Status: DC

## 2022-05-02 MED ORDER — LACTATED RINGERS IV SOLN: INTRAVENOUS | Status: DC

## 2022-05-02 MED ORDER — OMEPRAZOLE 40 MG PO CPDR: DELAYED_RELEASE_CAPSULE | ORAL | 5 refills | Status: AC

## 2022-05-02 SURGICAL SUPPLY — 25 items

## 2022-05-02 NOTE — Op Note (Signed)
[facility] Patient Name: Dana Hines Procedure Date : 05/02/2022 MRN: [id number] Attending MD: Jackquline Denmark , MD, [id number] Date of Birth: 1966-01-14 CSN: [id number] Age: 57 Admit Type: Outpatient Procedure:                Colonoscopy Indications:              Screening in patient at increased risk: FH CRC -                            dad at age 59 Providers:                Jackquline Denmark, MD, Dulcy Fanny, Benetta Spar, Technician Referring MD:             Dr Evorn Gong Medicines:                Monitored Anesthesia Care Complications:            No immediate complications. Estimated Blood Loss:     Estimated blood loss: none. Procedure:                Pre-Anesthesia Assessment:                           - Prior to the procedure, a History and Physical                            was performed, and patient medications and                            allergies were reviewed. The patient's tolerance of                            previous anesthesia was also reviewed. The risks                            and benefits of the procedure and the sedation                            options and risks were discussed with the patient.                            All questions were answered, and informed consent                            was obtained. Prior Anticoagulants: The patient has                            taken no anticoagulant or antiplatelet agents. ASA                            Grade Assessment: II - A patient with mild systemic  disease. After reviewing the risks and benefits,                            the patient was deemed in satisfactory condition to                            undergo the procedure.                           After obtaining informed consent, the colonoscope                            was passed under direct vision. Throughout the                            procedure, the patient's blood  pressure, pulse, and                            oxygen saturations were monitored continuously. The                            [id number] Olympus coloscope was                            introduced through the anus and advanced to the 2                            cm into the ileum. The colonoscopy was performed                            without difficulty. The patient tolerated the                            procedure well. The quality of the bowel                            preparation was good. The terminal ileum, ileocecal                            valve, appendiceal orifice, and rectum were                            photographed. Scope In: 10:24:34 AM Scope Out: 10:37:46 AM Scope Withdrawal Time: 0 hours 10 minutes 15 seconds  Total Procedure Duration: 0 hours 13 minutes 12 seconds  Findings:      A 4 mm polyp was found in the mid sigmoid colon. The polyp was sessile.       The polyp was removed with a cold snare. Resection and retrieval were       complete.      Non-bleeding internal hemorrhoids were found during retroflexion. The       hemorrhoids were small and Grade I (internal hemorrhoids that do not       prolapse). Note that the rectal veins were more prominent.      The terminal ileum appeared normal.  The exam was otherwise without abnormality on direct and retroflexion       views. Impression:               - One 4 mm polyp in the mid sigmoid colon, removed                            with a cold snare. Resected and retrieved.                           - Non-bleeding internal hemorrhoids.                           - The examined portion of the ileum was normal.                           - The examination was otherwise normal on direct                            and retroflexion views. Recommendation:           - Patient has a contact number available for                            emergencies. The signs and symptoms of potential                             delayed complications were discussed with the                            patient. Return to normal activities tomorrow.                            Written discharge instructions were provided to the                            patient.                           - Resume previous diet.                           - Continue present medications.                           - Await pathology results.                           - Repeat colonoscopy for surveillance based on                            pathology results.                           - The findings and recommendations were discussed  with the patient's family. Procedure Code(s):        --- Professional ---                           [phone], Colonoscopy, flexible; with removal of                            tumor(s), polyp(s), or other lesion(s) by snare                            technique Diagnosis Code(s):        --- Professional ---                           Z80.0, Family history of malignant neoplasm of                            digestive organs                           D12.5, Benign neoplasm of sigmoid colon                           K64.0, First degree hemorrhoids CPT copyright 2022 American [employer] Association. All rights reserved. The codes documented in this report are preliminary and upon coder review may  be revised to meet current compliance requirements. Jackquline Denmark, MD 05/02/2022 10:46:41 AM This report has been signed electronically. Number of Addenda: 0

## 2022-05-02 NOTE — Op Note (Addendum)
[facility] Patient Name: Dana Hines Procedure Date : 05/02/2022 MRN: [id number] Attending MD: Jackquline Denmark , MD, [id number] Date of Birth: 1966-02-09 CSN: [id number] Age: 57 Admit Type: Outpatient Procedure:                Upper GI endoscopy Indications:              Preop for bariatric surgery. Providers:                Jackquline Denmark, MD, Dulcy Fanny, Benetta Spar, Technician Referring MD:             Dr Evorn Gong Medicines:                Monitored Anesthesia Care Complications:            No immediate complications. Estimated Blood Loss:     Estimated blood loss: none. Estimated blood loss:                            none. Estimated blood loss was minimal. Procedure:                Pre-Anesthesia Assessment:                           - Prior to the procedure, a History and Physical                            was performed, and patient medications and                            allergies were reviewed. The patient's tolerance of                            previous anesthesia was also reviewed. The risks                            and benefits of the procedure and the sedation                            options and risks were discussed with the patient.                            All questions were answered, and informed consent                            was obtained. Prior Anticoagulants: The patient has                            taken no anticoagulant or antiplatelet agents. ASA                            Grade Assessment: II - A patient with mild systemic  disease. After reviewing the risks and benefits,                            the patient was deemed in satisfactory condition to                            undergo the procedure.                           After obtaining informed consent, the endoscope was                            passed under direct vision. Throughout the                             procedure, the patient's blood pressure, pulse, and                            oxygen saturations were monitored continuously. The                            [id number]) Olympus endoscope was introduced                            through the mouth, and advanced to the second part                            of duodenum. The upper GI endoscopy was                            accomplished without difficulty. The patient                            tolerated the procedure well. Unfortunately, due to                            technical reasons, only 2 pictures were captured. Scope In: Scope Out: Findings:      LA Grade A (one or more mucosal breaks less than 5 mm, not extending       between tops of 2 mucosal folds) esophagitis with no bleeding was found       38 cm from the incisors. Biopsies were taken with a cold forceps for       histology. No hiatal hernias.      Localized mild inflammation characterized by erythema was found in the       gastric antrum. Biopsies were taken with a cold forceps for histology.      The examined duodenum was normal. Impression:               - Erosive esophagitis (LA Gd A)                           - Gastritis. Biopsied. Recommendation:           - Patient has a contact number available for  emergencies. The signs and symptoms of potential                            delayed complications were discussed with the                            patient. Return to normal activities tomorrow.                            Written discharge instructions were provided to the                            patient.                           - Resume previous diet.                           - Omeprazole 40 mg p.o. QD #90, 4RF                           - Await pathology results.                           - The findings and recommendations were discussed                            with the patient's family. Procedure Code(s):        ---  Professional ---                           [phone], Esophagogastroduodenoscopy, flexible,                            transoral; with biopsy, single or multiple Diagnosis Code(s):        --- Professional ---                           K29.70, Gastritis, unspecified, without bleeding                           R10.13, Epigastric pain CPT copyright 2022 American Medical Association. All rights reserved. The codes documented in this report are preliminary and upon coder review may  be revised to meet current compliance requirements. Jackquline Denmark, MD 05/02/2022 10:43:00 AM This report has been signed electronically. Number of Addenda: 0

## 2022-05-02 NOTE — Transfer of Care (Signed)
Immediate Anesthesia Transfer of Care Note  Patient: Dana Hines  Procedure(s) Performed: COLONOSCOPY WITH PROPOFOL ESOPHAGOGASTRODUODENOSCOPY (EGD) WITH PROPOFOL BIOPSY POLYPECTOMY  Patient Location: PACU  Anesthesia Type:MAC  Level of Consciousness: sedated  Airway & Oxygen Therapy: Patient Spontanous Breathing and Patient connected to nasal cannula oxygen  Post-op Assessment: Report given to RN and Post -op Vital signs reviewed and stable  Post vital signs: Reviewed and stable  Last Vitals:  Vitals Value Taken Time  BP 140/95   Temp 98   Pulse 98   Resp 16   SpO2 98     Last Pain:  Vitals:   05/02/22 0951  TempSrc: Temporal  PainSc: 0-No pain         Complications: No notable events documented.

## 2022-05-02 NOTE — Anesthesia Preprocedure Evaluation (Addendum)
Anesthesia Evaluation  Patient identified by MRN, date of birth, ID band Patient awake    Reviewed: Allergy & Precautions, NPO status , Patient's Chart, lab work & pertinent test results  Airway Mallampati: III  TM Distance: >3 FB Neck ROM: Full    Dental  (+) Teeth Intact, Dental Advisory Given   Pulmonary    breath sounds clear to auscultation       Cardiovascular hypertension, Pt. on medications  Rhythm:Regular Rate:Normal     Neuro/Psych   Anxiety        GI/Hepatic Neg liver ROS,GERD  Medicated and Controlled,,  Endo/Other  diabetes    Renal/GU negative Renal ROS     Musculoskeletal  (+) Arthritis ,    Abdominal   Peds  Hematology negative hematology ROS (+)   Anesthesia Other Findings - HLD  Reproductive/Obstetrics                             Anesthesia Physical Anesthesia Plan  ASA: 2  Anesthesia Plan: MAC   Post-op Pain Management: Minimal or no pain anticipated   Induction: Intravenous  PONV Risk Score and Plan: 0  Airway Management Planned: Natural Airway and Nasal Cannula  Additional Equipment: None  Intra-op Plan:   Post-operative Plan:   Informed Consent: I have reviewed the patients History and Physical, chart, labs and discussed the procedure including the risks, benefits and alternatives for the proposed anesthesia with the patient or authorized representative who has indicated his/her understanding and acceptance.       Plan Discussed with: CRNA  Anesthesia Plan Comments:        Anesthesia Quick Evaluation

## 2022-05-02 NOTE — H&P (Signed)
Chief Complaint: For GI eval   Referring Provider:  Marco Collie, MD        ASSESSMENT AND PLAN;    #1. FH CRC (dad at age 57)   #2. Pre-op for Bariatric Sx   Plan: -EGD/Colon at [facility] (due to high BMI)     I discussed EGD/Colonoscopy- the indications, risks, alternatives and potential complications including, but not limited to bleeding, infection, reaction to meds, damage to internal organs, cardiac and/or pulmonary problems, and perforation requiring surgery. The possibility that significant findings could be missed was explained. All ? were answered. Pt consents to proceed.   EGD is for epi tenderness/epigastric pain.   HPI:     BLODWEN BUERGER is a 57 y.o. female  B/L femoral congenital agenesis, HLD, HTN, morbid obesity, anxiety   No nausea, vomiting, heartburn, regurgitation, odynophagia or dysphagia.  No significant diarrhea or constipation.  No melena or hematochezia. No unintentional weight loss. No abdominal pain.   Occ Diarrhea with milk and " greasy foods"   Seen Dr Evorn Gong for bariatric Sx, 05/2022. Per his note, needs preop EGD.   Prev colon in [place] over 10 yrs ago- neg.       Past Medical History:  Diagnosis Date   Anxiety     Arthritis     Blood transfusion without reported diagnosis      2014   Diabetes mellitus without complication ([place])      takes Ozempic for weight control, no hx of diabetes   Hyperlipidemia     Hypertension             Past Surgical History:  Procedure Laterality Date   CESAREAN SECTION       COLONOSCOPY       DILATION AND CURETTAGE OF UTERUS   10/24/2021   kidney repair        intesinal repair 2014           Family History  Problem Relation Age of Onset   Colon cancer Father          dx in his 76's   Esophageal cancer Neg Hx     Rectal cancer Neg Hx     Stomach cancer Neg Hx        Social History        Tobacco Use   Smoking status: Never   Smokeless tobacco: Never  Vaping Use   Vaping Use: Never  used  Substance Use Topics   Alcohol use: Not Currently   Drug use: Never            Current Outpatient Medications  Medication Sig Dispense Refill   amLODipine (NORVASC) 5 MG tablet Take 1 tablet by mouth daily.       atorvastatin (LIPITOR) 10 MG tablet Take 1 tablet by mouth daily.       citalopram (CELEXA) 40 MG tablet Take 40 mg by mouth daily.       gabapentin (NEURONTIN) 300 MG capsule Take by mouth 4 (four) times daily.       lisinopril (ZESTRIL) 10 MG tablet Take 1 tablet by mouth daily.       olmesartan (BENICAR) 20 MG tablet Take by mouth daily.       oxyCODONE-acetaminophen (PERCOCET) 10-325 MG tablet Take 1 tablet by mouth 4 (four) times daily as needed.       tiZANidine (ZANAFLEX) 2 MG tablet Take 2-4 mg by mouth 3 (three) times daily as needed.  oxyCODONE-acetaminophen (PERCOCET) 10-325 MG tablet once a week. (Patient not taking: Reported on 01/23/2022)        No current facility-administered medications for this visit.           Allergies  Allergen Reactions   Morphine And Related        15 years ago- itching, hives      Review of Systems:  Constitutional: Denies fever, chills, diaphoresis, appetite change and fatigue.  HEENT: Denies photophobia, eye pain, redness, hearing loss, ear pain, congestion, sore throat, rhinorrhea, sneezing, mouth sores, neck pain, neck stiffness and tinnitus.   Respiratory: Denies SOB, DOE, cough, chest tightness,  and wheezing.   Cardiovascular: Denies chest pain, palpitations and leg swelling.  Genitourinary: Denies dysuria, urgency, frequency, hematuria, flank pain and difficulty urinating.  Musculoskeletal: has myalgias, back pain, joint swelling, arthralgias and gait problem.  Skin: No rash.  Neurological: Denies dizziness, seizures, syncope, weakness, light-headedness, numbness and headaches.  Hematological: Denies adenopathy. Easy bruising, personal or family bleeding history  Psychiatric/Behavioral: Has  anxiety or  depression       Physical Exam:     BP 118/74   Pulse 84   Ht 3' 4"$  (1.016 m)   Wt 224 lb 6.4 oz (101.8 kg)   SpO2 97%   BMI 98.61 kg/m     Wt Readings from Last 3 Encounters:  01/23/22 224 lb 6.4 oz (101.8 kg)  07/10/20 219 lb 12.8 oz (99.7 kg)    Constitutional:  Well-developed, in no acute distress. Psychiatric: Normal mood and affect. Behavior is normal. HEENT: Pupils normal.  Conjunctivae are normal. No scleral icterus. Cardiovascular: Normal rate, regular rhythm. No edema Pulmonary/chest: Effort normal and breath sounds normal. No wheezing, rales or rhonchi. Abdominal: Soft, nondistended. Epi tender. Bowel sounds active throughout. There are no masses palpable. No hepatomegaly. Rectal: Deferred Neurological: Alert and oriented to person place and time. Skin: Skin is warm and dry. No rashes noted.     Carmell Austria, MD [facility] [phone]

## 2022-05-02 NOTE — Anesthesia Postprocedure Evaluation (Signed)
Anesthesia Post Note  Patient: Dana Hines  Procedure(s) Performed: COLONOSCOPY WITH PROPOFOL ESOPHAGOGASTRODUODENOSCOPY (EGD) WITH PROPOFOL BIOPSY POLYPECTOMY     Patient location during evaluation: PACU Anesthesia Type: MAC Level of consciousness: awake and alert Pain management: pain level controlled Vital Signs Assessment: post-procedure vital signs reviewed and stable Respiratory status: spontaneous breathing, nonlabored ventilation, respiratory function stable and patient connected to nasal cannula oxygen Cardiovascular status: stable and blood pressure returned to baseline Postop Assessment: no apparent nausea or vomiting Anesthetic complications: no  No notable events documented.  Last Vitals:  Vitals:   05/02/22 1100 05/02/22 1115  BP: (!) 150/61 (!) 140/70  Pulse: 81 74  Resp: 18 20  Temp:  36.4 C  SpO2: 97% 96%    Last Pain:  Vitals:   05/02/22 1115  TempSrc:   PainSc: 0-No pain                 Effie Berkshire

## 2022-05-03 LAB — SURGICAL PATHOLOGY

## 2022-05-04 ENCOUNTER — Encounter: Payer: Self-pay | Admitting: Gastroenterology

## 2022-05-05 ENCOUNTER — Encounter (HOSPITAL_COMMUNITY): Payer: Self-pay | Admitting: Gastroenterology

## 2022-05-06 DIAGNOSIS — I10 Essential (primary) hypertension: Secondary | ICD-10-CM | POA: Diagnosis not present

## 2022-05-06 DIAGNOSIS — G4733 Obstructive sleep apnea (adult) (pediatric): Secondary | ICD-10-CM | POA: Diagnosis not present

## 2022-05-08 DIAGNOSIS — R7303 Prediabetes: Secondary | ICD-10-CM | POA: Diagnosis not present

## 2022-05-08 DIAGNOSIS — E785 Hyperlipidemia, unspecified: Secondary | ICD-10-CM | POA: Diagnosis not present

## 2022-05-08 DIAGNOSIS — K660 Peritoneal adhesions (postprocedural) (postinfection): Secondary | ICD-10-CM | POA: Diagnosis not present

## 2022-05-08 DIAGNOSIS — F32A Depression, unspecified: Secondary | ICD-10-CM | POA: Diagnosis not present

## 2022-05-08 DIAGNOSIS — E559 Vitamin D deficiency, unspecified: Secondary | ICD-10-CM | POA: Diagnosis not present

## 2022-05-08 DIAGNOSIS — Z79899 Other long term (current) drug therapy: Secondary | ICD-10-CM | POA: Diagnosis not present

## 2022-05-08 DIAGNOSIS — Z885 Allergy status to narcotic agent status: Secondary | ICD-10-CM | POA: Diagnosis not present

## 2022-05-08 DIAGNOSIS — I10 Essential (primary) hypertension: Secondary | ICD-10-CM | POA: Diagnosis not present

## 2022-05-08 DIAGNOSIS — E8881 Metabolic syndrome: Secondary | ICD-10-CM | POA: Diagnosis not present

## 2022-05-08 DIAGNOSIS — G4733 Obstructive sleep apnea (adult) (pediatric): Secondary | ICD-10-CM | POA: Diagnosis not present

## 2022-05-08 DIAGNOSIS — Z538 Procedure and treatment not carried out for other reasons: Secondary | ICD-10-CM | POA: Diagnosis not present

## 2022-05-10 DIAGNOSIS — I10 Essential (primary) hypertension: Secondary | ICD-10-CM | POA: Diagnosis not present

## 2022-05-11 DIAGNOSIS — G4733 Obstructive sleep apnea (adult) (pediatric): Secondary | ICD-10-CM | POA: Diagnosis not present

## 2022-05-14 ENCOUNTER — Other Ambulatory Visit (HOSPITAL_COMMUNITY): Payer: Self-pay

## 2022-05-14 MED ORDER — PHENTERMINE HCL 15 MG PO CAPS
15.0000 mg | ORAL_CAPSULE | Freq: Every day | ORAL | 1 refills | Status: DC
  Filled 2022-05-14: qty 30, 30d supply, fill #0
  Filled 2022-06-13: qty 30, 30d supply, fill #1

## 2022-05-27 ENCOUNTER — Other Ambulatory Visit (HOSPITAL_COMMUNITY): Payer: Self-pay

## 2022-05-27 DIAGNOSIS — M5412 Radiculopathy, cervical region: Secondary | ICD-10-CM | POA: Diagnosis not present

## 2022-05-27 DIAGNOSIS — G894 Chronic pain syndrome: Secondary | ICD-10-CM | POA: Diagnosis not present

## 2022-05-27 DIAGNOSIS — G4700 Insomnia, unspecified: Secondary | ICD-10-CM | POA: Diagnosis not present

## 2022-05-27 DIAGNOSIS — M47812 Spondylosis without myelopathy or radiculopathy, cervical region: Secondary | ICD-10-CM | POA: Diagnosis not present

## 2022-05-27 MED ORDER — OXYCODONE HCL 10 MG PO TABS
10.0000 mg | ORAL_TABLET | Freq: Four times a day (QID) | ORAL | 0 refills | Status: DC | PRN
  Filled 2022-05-27: qty 120, 30d supply, fill #0

## 2022-06-10 DIAGNOSIS — I10 Essential (primary) hypertension: Secondary | ICD-10-CM | POA: Diagnosis not present

## 2022-06-11 DIAGNOSIS — G4733 Obstructive sleep apnea (adult) (pediatric): Secondary | ICD-10-CM | POA: Diagnosis not present

## 2022-06-12 DIAGNOSIS — I10 Essential (primary) hypertension: Secondary | ICD-10-CM | POA: Diagnosis not present

## 2022-06-12 DIAGNOSIS — R7303 Prediabetes: Secondary | ICD-10-CM | POA: Diagnosis not present

## 2022-06-12 DIAGNOSIS — E785 Hyperlipidemia, unspecified: Secondary | ICD-10-CM | POA: Diagnosis not present

## 2022-06-13 ENCOUNTER — Other Ambulatory Visit (HOSPITAL_COMMUNITY): Payer: Self-pay

## 2022-06-14 ENCOUNTER — Other Ambulatory Visit (HOSPITAL_COMMUNITY): Payer: Self-pay

## 2022-06-17 DIAGNOSIS — E785 Hyperlipidemia, unspecified: Secondary | ICD-10-CM | POA: Diagnosis not present

## 2022-06-17 DIAGNOSIS — R7303 Prediabetes: Secondary | ICD-10-CM | POA: Diagnosis not present

## 2022-06-17 DIAGNOSIS — I10 Essential (primary) hypertension: Secondary | ICD-10-CM | POA: Diagnosis not present

## 2022-06-26 ENCOUNTER — Other Ambulatory Visit (HOSPITAL_COMMUNITY): Payer: Self-pay

## 2022-06-26 DIAGNOSIS — M47812 Spondylosis without myelopathy or radiculopathy, cervical region: Secondary | ICD-10-CM | POA: Diagnosis not present

## 2022-06-26 DIAGNOSIS — G894 Chronic pain syndrome: Secondary | ICD-10-CM | POA: Diagnosis not present

## 2022-06-26 DIAGNOSIS — M5412 Radiculopathy, cervical region: Secondary | ICD-10-CM | POA: Diagnosis not present

## 2022-06-26 DIAGNOSIS — G4700 Insomnia, unspecified: Secondary | ICD-10-CM | POA: Diagnosis not present

## 2022-06-26 MED ORDER — OXYCODONE HCL 10 MG PO TABS
10.0000 mg | ORAL_TABLET | Freq: Four times a day (QID) | ORAL | 0 refills | Status: DC | PRN
  Filled 2022-06-26: qty 120, 30d supply, fill #0

## 2022-07-06 ENCOUNTER — Other Ambulatory Visit (HOSPITAL_COMMUNITY): Payer: Self-pay

## 2022-07-10 DIAGNOSIS — I10 Essential (primary) hypertension: Secondary | ICD-10-CM | POA: Diagnosis not present

## 2022-07-11 DIAGNOSIS — Z136 Encounter for screening for cardiovascular disorders: Secondary | ICD-10-CM | POA: Diagnosis not present

## 2022-07-11 DIAGNOSIS — Z1389 Encounter for screening for other disorder: Secondary | ICD-10-CM | POA: Diagnosis not present

## 2022-07-11 DIAGNOSIS — G4733 Obstructive sleep apnea (adult) (pediatric): Secondary | ICD-10-CM | POA: Diagnosis not present

## 2022-07-11 DIAGNOSIS — Z139 Encounter for screening, unspecified: Secondary | ICD-10-CM | POA: Diagnosis not present

## 2022-07-11 DIAGNOSIS — Z0000 Encounter for general adult medical examination without abnormal findings: Secondary | ICD-10-CM | POA: Diagnosis not present

## 2022-07-16 ENCOUNTER — Other Ambulatory Visit (HOSPITAL_COMMUNITY): Payer: Self-pay

## 2022-07-16 MED ORDER — PHENTERMINE HCL 15 MG PO CAPS
ORAL_CAPSULE | ORAL | 0 refills | Status: AC
  Filled 2022-07-16: qty 30, 30d supply, fill #0

## 2022-07-23 ENCOUNTER — Other Ambulatory Visit (HOSPITAL_COMMUNITY): Payer: Self-pay

## 2022-07-24 ENCOUNTER — Other Ambulatory Visit (HOSPITAL_COMMUNITY): Payer: Self-pay

## 2022-07-25 ENCOUNTER — Other Ambulatory Visit (HOSPITAL_COMMUNITY): Payer: Self-pay

## 2022-07-25 DIAGNOSIS — Z79891 Long term (current) use of opiate analgesic: Secondary | ICD-10-CM | POA: Diagnosis not present

## 2022-07-25 DIAGNOSIS — G894 Chronic pain syndrome: Secondary | ICD-10-CM | POA: Diagnosis not present

## 2022-07-25 DIAGNOSIS — G4700 Insomnia, unspecified: Secondary | ICD-10-CM | POA: Diagnosis not present

## 2022-07-25 DIAGNOSIS — M47812 Spondylosis without myelopathy or radiculopathy, cervical region: Secondary | ICD-10-CM | POA: Diagnosis not present

## 2022-07-25 DIAGNOSIS — M5412 Radiculopathy, cervical region: Secondary | ICD-10-CM | POA: Diagnosis not present

## 2022-07-25 MED ORDER — TIZANIDINE HCL 2 MG PO TABS
2.0000 mg | ORAL_TABLET | Freq: Three times a day (TID) | ORAL | 0 refills | Status: AC | PRN
  Filled 2023-01-17: qty 540, 90d supply, fill #0

## 2022-07-25 MED ORDER — OXYCODONE HCL 10 MG PO TABS
10.0000 mg | ORAL_TABLET | Freq: Four times a day (QID) | ORAL | 0 refills | Status: DC | PRN
  Filled 2022-07-25: qty 120, 30d supply, fill #0

## 2022-07-25 MED ORDER — GABAPENTIN 300 MG PO CAPS
600.0000 mg | ORAL_CAPSULE | Freq: Four times a day (QID) | ORAL | 0 refills | Status: DC
  Filled 2022-07-25: qty 720, 90d supply, fill #0

## 2022-07-30 ENCOUNTER — Other Ambulatory Visit (HOSPITAL_COMMUNITY): Payer: Self-pay

## 2022-07-30 DIAGNOSIS — E785 Hyperlipidemia, unspecified: Secondary | ICD-10-CM | POA: Diagnosis not present

## 2022-07-30 DIAGNOSIS — I10 Essential (primary) hypertension: Secondary | ICD-10-CM | POA: Diagnosis not present

## 2022-07-30 DIAGNOSIS — R634 Abnormal weight loss: Secondary | ICD-10-CM | POA: Diagnosis not present

## 2022-07-30 DIAGNOSIS — R7303 Prediabetes: Secondary | ICD-10-CM | POA: Diagnosis not present

## 2022-07-30 MED ORDER — PHENTERMINE HCL 37.5 MG PO TABS
ORAL_TABLET | ORAL | 1 refills | Status: DC
  Filled 2022-07-30: qty 30, 30d supply, fill #0
  Filled 2022-08-26: qty 30, 30d supply, fill #1

## 2022-08-10 DIAGNOSIS — I10 Essential (primary) hypertension: Secondary | ICD-10-CM | POA: Diagnosis not present

## 2022-08-11 DIAGNOSIS — G4733 Obstructive sleep apnea (adult) (pediatric): Secondary | ICD-10-CM | POA: Diagnosis not present

## 2022-08-14 ENCOUNTER — Other Ambulatory Visit: Payer: Self-pay | Admitting: Family Medicine

## 2022-08-14 DIAGNOSIS — Z1231 Encounter for screening mammogram for malignant neoplasm of breast: Secondary | ICD-10-CM

## 2022-08-26 ENCOUNTER — Other Ambulatory Visit (HOSPITAL_COMMUNITY): Payer: Self-pay

## 2022-08-26 DIAGNOSIS — M5412 Radiculopathy, cervical region: Secondary | ICD-10-CM | POA: Diagnosis not present

## 2022-08-26 DIAGNOSIS — G894 Chronic pain syndrome: Secondary | ICD-10-CM | POA: Diagnosis not present

## 2022-08-26 DIAGNOSIS — G4700 Insomnia, unspecified: Secondary | ICD-10-CM | POA: Diagnosis not present

## 2022-08-26 DIAGNOSIS — M47812 Spondylosis without myelopathy or radiculopathy, cervical region: Secondary | ICD-10-CM | POA: Diagnosis not present

## 2022-08-26 MED ORDER — OXYCODONE HCL 10 MG PO TABS
10.0000 mg | ORAL_TABLET | Freq: Four times a day (QID) | ORAL | 0 refills | Status: AC | PRN
  Filled 2022-08-26: qty 120, 30d supply, fill #0

## 2022-08-26 MED ORDER — OXYCODONE HCL 10 MG PO TABS
ORAL_TABLET | ORAL | 0 refills | Status: AC
  Filled 2022-09-25: qty 120, 30d supply, fill #0

## 2022-09-09 DIAGNOSIS — I10 Essential (primary) hypertension: Secondary | ICD-10-CM | POA: Diagnosis not present

## 2022-09-10 DIAGNOSIS — G4733 Obstructive sleep apnea (adult) (pediatric): Secondary | ICD-10-CM | POA: Diagnosis not present

## 2022-09-18 DIAGNOSIS — E119 Type 2 diabetes mellitus without complications: Secondary | ICD-10-CM | POA: Diagnosis not present

## 2022-09-23 ENCOUNTER — Other Ambulatory Visit (HOSPITAL_COMMUNITY): Payer: Self-pay

## 2022-09-23 ENCOUNTER — Other Ambulatory Visit: Payer: Self-pay

## 2022-09-23 MED ORDER — PHENTERMINE HCL 37.5 MG PO TABS
37.5000 mg | ORAL_TABLET | Freq: Every morning | ORAL | 0 refills | Status: DC
  Filled 2022-09-23: qty 30, 30d supply, fill #0

## 2022-09-24 ENCOUNTER — Other Ambulatory Visit (HOSPITAL_COMMUNITY): Payer: Self-pay

## 2022-09-24 MED ORDER — CITALOPRAM HYDROBROMIDE 40 MG PO TABS
40.0000 mg | ORAL_TABLET | Freq: Every day | ORAL | 3 refills | Status: AC
  Filled 2022-09-24: qty 90, 90d supply, fill #0

## 2022-09-24 MED ORDER — ATORVASTATIN CALCIUM 10 MG PO TABS
10.0000 mg | ORAL_TABLET | Freq: Every day | ORAL | 1 refills | Status: AC
  Filled 2022-09-24: qty 90, 90d supply, fill #0

## 2022-09-24 MED ORDER — LISINOPRIL 10 MG PO TABS
10.0000 mg | ORAL_TABLET | Freq: Every day | ORAL | 0 refills | Status: DC
  Filled 2022-09-24: qty 90, 90d supply, fill #0

## 2022-09-24 MED ORDER — AMLODIPINE BESYLATE 5 MG PO TABS
5.0000 mg | ORAL_TABLET | Freq: Every day | ORAL | 0 refills | Status: AC
  Filled 2022-09-24: qty 90, 90d supply, fill #0

## 2022-09-24 MED ORDER — ARIPIPRAZOLE 5 MG PO TABS
5.0000 mg | ORAL_TABLET | Freq: Every day | ORAL | 0 refills | Status: AC
  Filled 2022-09-24: qty 90, 90d supply, fill #0

## 2022-09-25 ENCOUNTER — Other Ambulatory Visit (HOSPITAL_COMMUNITY): Payer: Self-pay

## 2022-09-25 ENCOUNTER — Inpatient Hospital Stay: Admission: RE | Admit: 2022-09-25 | Payer: 59 | Source: Ambulatory Visit

## 2022-09-25 ENCOUNTER — Other Ambulatory Visit: Payer: Self-pay

## 2022-10-09 ENCOUNTER — Other Ambulatory Visit (HOSPITAL_COMMUNITY): Payer: Self-pay

## 2022-10-09 DIAGNOSIS — G4730 Sleep apnea, unspecified: Secondary | ICD-10-CM | POA: Diagnosis not present

## 2022-10-09 DIAGNOSIS — I10 Essential (primary) hypertension: Secondary | ICD-10-CM | POA: Diagnosis not present

## 2022-10-09 DIAGNOSIS — R7303 Prediabetes: Secondary | ICD-10-CM | POA: Diagnosis not present

## 2022-10-09 DIAGNOSIS — E785 Hyperlipidemia, unspecified: Secondary | ICD-10-CM | POA: Diagnosis not present

## 2022-10-09 DIAGNOSIS — E559 Vitamin D deficiency, unspecified: Secondary | ICD-10-CM | POA: Diagnosis not present

## 2022-10-09 MED ORDER — PHENTERMINE HCL 37.5 MG PO TABS
ORAL_TABLET | ORAL | 1 refills | Status: AC
  Filled 2022-10-09: qty 30, 30d supply, fill #0

## 2022-10-10 ENCOUNTER — Other Ambulatory Visit (HOSPITAL_COMMUNITY): Payer: Self-pay

## 2022-10-10 DIAGNOSIS — I10 Essential (primary) hypertension: Secondary | ICD-10-CM | POA: Diagnosis not present

## 2022-10-10 MED ORDER — VITAMIN D3 1.25 MG (50000 UT) PO TABS: 1.0000 | ORAL_TABLET | ORAL | 0 refills | Status: AC

## 2022-10-11 DIAGNOSIS — G4733 Obstructive sleep apnea (adult) (pediatric): Secondary | ICD-10-CM | POA: Diagnosis not present

## 2022-10-14 DIAGNOSIS — I10 Essential (primary) hypertension: Secondary | ICD-10-CM | POA: Diagnosis not present

## 2022-10-14 DIAGNOSIS — R7303 Prediabetes: Secondary | ICD-10-CM | POA: Diagnosis not present

## 2022-10-14 DIAGNOSIS — E785 Hyperlipidemia, unspecified: Secondary | ICD-10-CM | POA: Diagnosis not present

## 2022-10-18 DIAGNOSIS — E785 Hyperlipidemia, unspecified: Secondary | ICD-10-CM | POA: Diagnosis not present

## 2022-10-18 DIAGNOSIS — I10 Essential (primary) hypertension: Secondary | ICD-10-CM | POA: Diagnosis not present

## 2022-10-18 DIAGNOSIS — R7303 Prediabetes: Secondary | ICD-10-CM | POA: Diagnosis not present

## 2022-10-22 ENCOUNTER — Other Ambulatory Visit (HOSPITAL_COMMUNITY): Payer: Self-pay

## 2022-10-22 ENCOUNTER — Other Ambulatory Visit: Payer: Self-pay

## 2022-10-22 DIAGNOSIS — M47812 Spondylosis without myelopathy or radiculopathy, cervical region: Secondary | ICD-10-CM | POA: Diagnosis not present

## 2022-10-22 DIAGNOSIS — G4700 Insomnia, unspecified: Secondary | ICD-10-CM | POA: Diagnosis not present

## 2022-10-22 DIAGNOSIS — M5412 Radiculopathy, cervical region: Secondary | ICD-10-CM | POA: Diagnosis not present

## 2022-10-22 DIAGNOSIS — G894 Chronic pain syndrome: Secondary | ICD-10-CM | POA: Diagnosis not present

## 2022-10-22 MED ORDER — TIZANIDINE HCL 2 MG PO TABS
2.0000 mg | ORAL_TABLET | Freq: Three times a day (TID) | ORAL | 0 refills | Status: AC | PRN
  Filled 2022-10-22: qty 540, 90d supply, fill #0

## 2022-10-22 MED ORDER — GABAPENTIN 300 MG PO CAPS
600.0000 mg | ORAL_CAPSULE | Freq: Four times a day (QID) | ORAL | 0 refills | Status: DC
  Filled 2022-10-22: qty 720, 90d supply, fill #0

## 2022-10-22 MED ORDER — OXYCODONE HCL 10 MG PO TABS
10.0000 mg | ORAL_TABLET | Freq: Four times a day (QID) | ORAL | 0 refills | Status: AC | PRN
  Filled 2022-10-22 – 2022-10-23 (×3): qty 120, 30d supply, fill #0

## 2022-10-23 ENCOUNTER — Other Ambulatory Visit (HOSPITAL_COMMUNITY): Payer: Self-pay

## 2022-11-10 DIAGNOSIS — E785 Hyperlipidemia, unspecified: Secondary | ICD-10-CM | POA: Diagnosis not present

## 2022-11-11 DIAGNOSIS — G4733 Obstructive sleep apnea (adult) (pediatric): Secondary | ICD-10-CM | POA: Diagnosis not present

## 2022-11-14 ENCOUNTER — Other Ambulatory Visit (HOSPITAL_COMMUNITY): Payer: Self-pay

## 2022-11-14 MED ORDER — IBUPROFEN 800 MG PO TABS
800.0000 mg | ORAL_TABLET | Freq: Three times a day (TID) | ORAL | 0 refills | Status: AC
  Filled 2022-11-14: qty 15, 5d supply, fill #0

## 2022-11-14 MED ORDER — AMOXICILLIN 500 MG PO CAPS
500.0000 mg | ORAL_CAPSULE | Freq: Four times a day (QID) | ORAL | 0 refills | Status: AC
  Filled 2022-11-14: qty 28, 7d supply, fill #0

## 2022-11-20 ENCOUNTER — Other Ambulatory Visit (HOSPITAL_COMMUNITY): Payer: Self-pay

## 2022-11-20 DIAGNOSIS — G894 Chronic pain syndrome: Secondary | ICD-10-CM | POA: Diagnosis not present

## 2022-11-20 DIAGNOSIS — G4700 Insomnia, unspecified: Secondary | ICD-10-CM | POA: Diagnosis not present

## 2022-11-20 DIAGNOSIS — M5412 Radiculopathy, cervical region: Secondary | ICD-10-CM | POA: Diagnosis not present

## 2022-11-20 DIAGNOSIS — M47812 Spondylosis without myelopathy or radiculopathy, cervical region: Secondary | ICD-10-CM | POA: Diagnosis not present

## 2022-11-20 MED ORDER — OXYCODONE HCL 10 MG PO TABS
10.0000 mg | ORAL_TABLET | Freq: Four times a day (QID) | ORAL | 0 refills | Status: AC | PRN
  Filled 2022-11-20: qty 120, 30d supply, fill #0

## 2022-11-29 ENCOUNTER — Other Ambulatory Visit (HOSPITAL_COMMUNITY): Payer: Self-pay

## 2022-11-29 ENCOUNTER — Other Ambulatory Visit (HOSPITAL_BASED_OUTPATIENT_CLINIC_OR_DEPARTMENT_OTHER): Payer: Self-pay

## 2022-11-29 MED ORDER — PHENTERMINE HCL 37.5 MG PO TABS
ORAL_TABLET | ORAL | 0 refills | Status: AC
Start: 2022-11-29 — End: ?
  Filled 2022-11-29: qty 30, 30d supply, fill #0

## 2022-12-10 DIAGNOSIS — E785 Hyperlipidemia, unspecified: Secondary | ICD-10-CM | POA: Diagnosis not present

## 2022-12-19 ENCOUNTER — Other Ambulatory Visit (HOSPITAL_COMMUNITY): Payer: Self-pay

## 2022-12-19 DIAGNOSIS — M47812 Spondylosis without myelopathy or radiculopathy, cervical region: Secondary | ICD-10-CM | POA: Diagnosis not present

## 2022-12-19 DIAGNOSIS — G4700 Insomnia, unspecified: Secondary | ICD-10-CM | POA: Diagnosis not present

## 2022-12-19 DIAGNOSIS — G894 Chronic pain syndrome: Secondary | ICD-10-CM | POA: Diagnosis not present

## 2022-12-19 DIAGNOSIS — M5412 Radiculopathy, cervical region: Secondary | ICD-10-CM | POA: Diagnosis not present

## 2022-12-19 MED ORDER — OXYCODONE HCL 10 MG PO TABS
10.0000 mg | ORAL_TABLET | Freq: Four times a day (QID) | ORAL | 0 refills | Status: AC | PRN
  Filled 2022-12-19: qty 120, 30d supply, fill #0

## 2023-01-07 IMAGING — MG MM DIGITAL SCREENING BILAT W/ TOMO AND CAD
6 of 12 series · 6 of 36 positions shown · non-contrast
Comparison: Previous exam(s).

CLINICAL DATA: Screening.

EXAM:
DIGITAL SCREENING BILATERAL MAMMOGRAM WITH TOMOSYNTHESIS AND CAD
TECHNIQUE: Bilateral screening digital craniocaudal and mediolateral oblique
mammograms were obtained. Bilateral screening digital breast
tomosynthesis was performed. The images were evaluated with
computer-aided detection.

[L MLO synth-2D (1 of 2)]
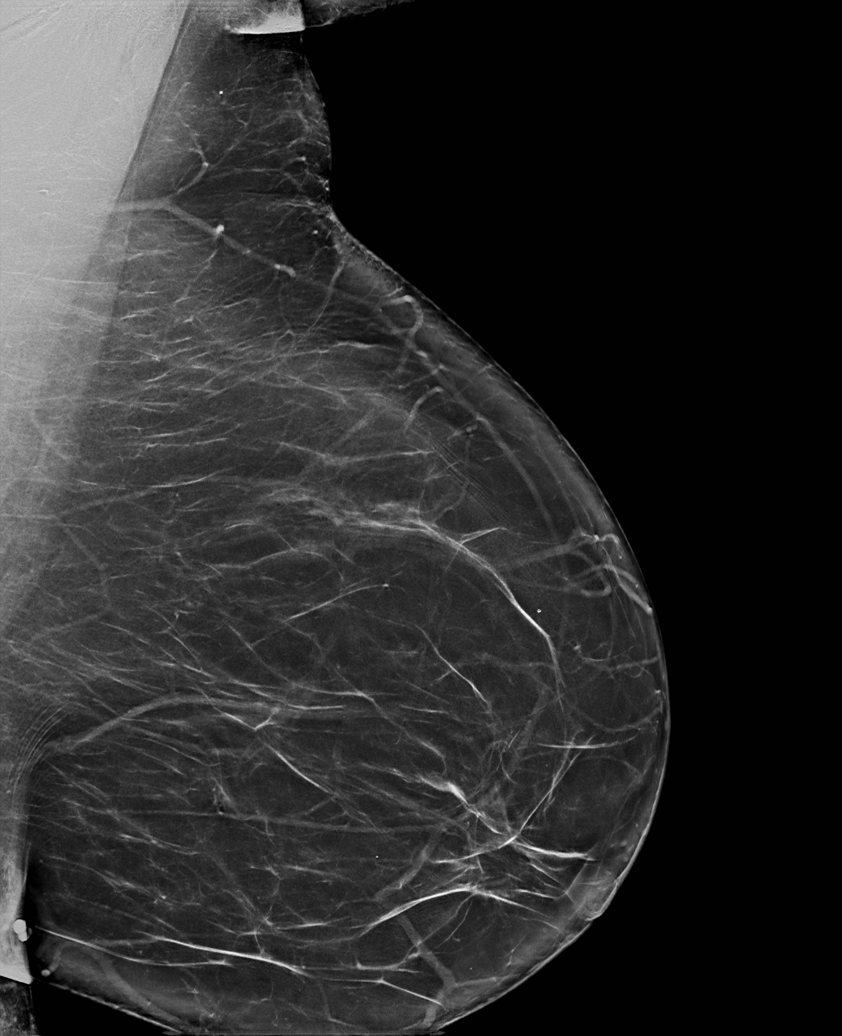

[L MLO synth-2D (2 of 2)]
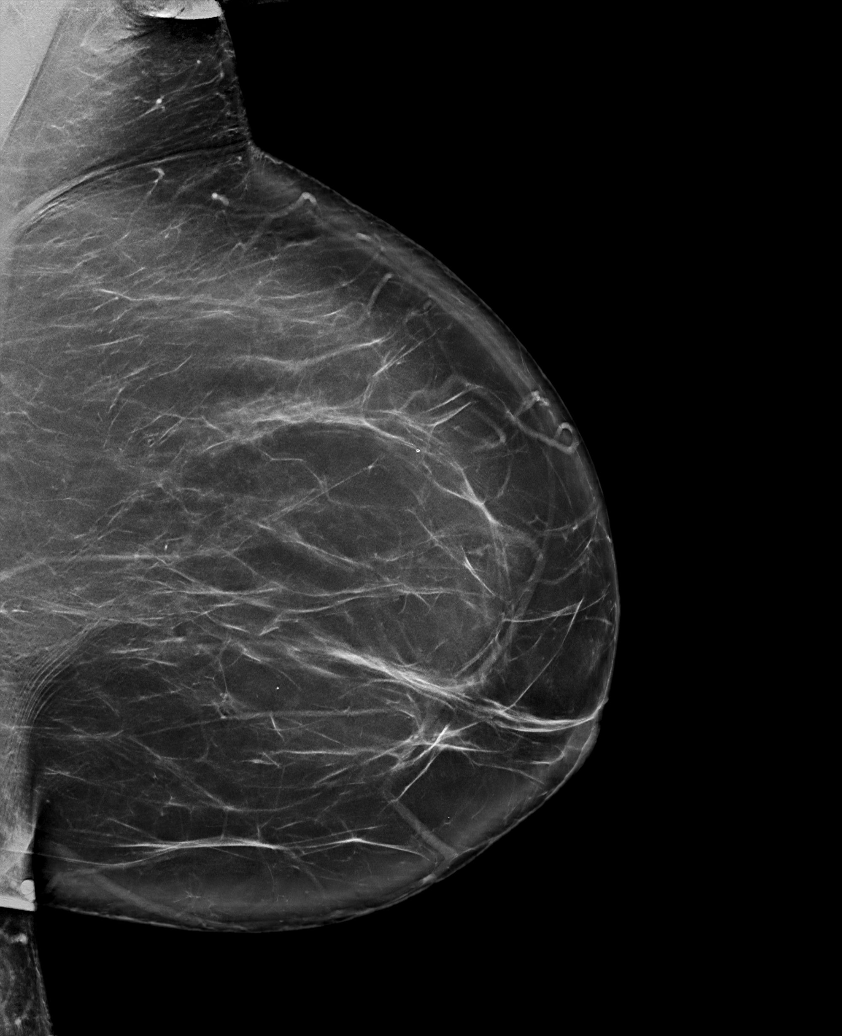

[R CC synth-2D (1 of 2)]
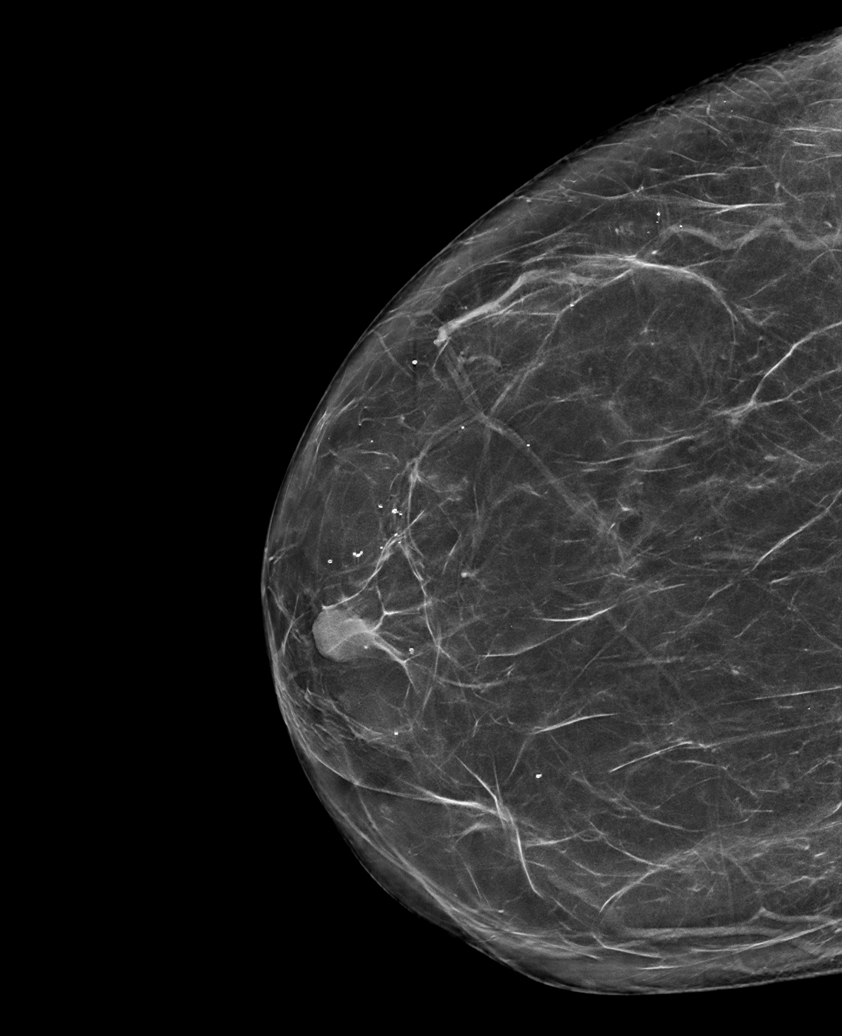

[R MLO synth-2D]
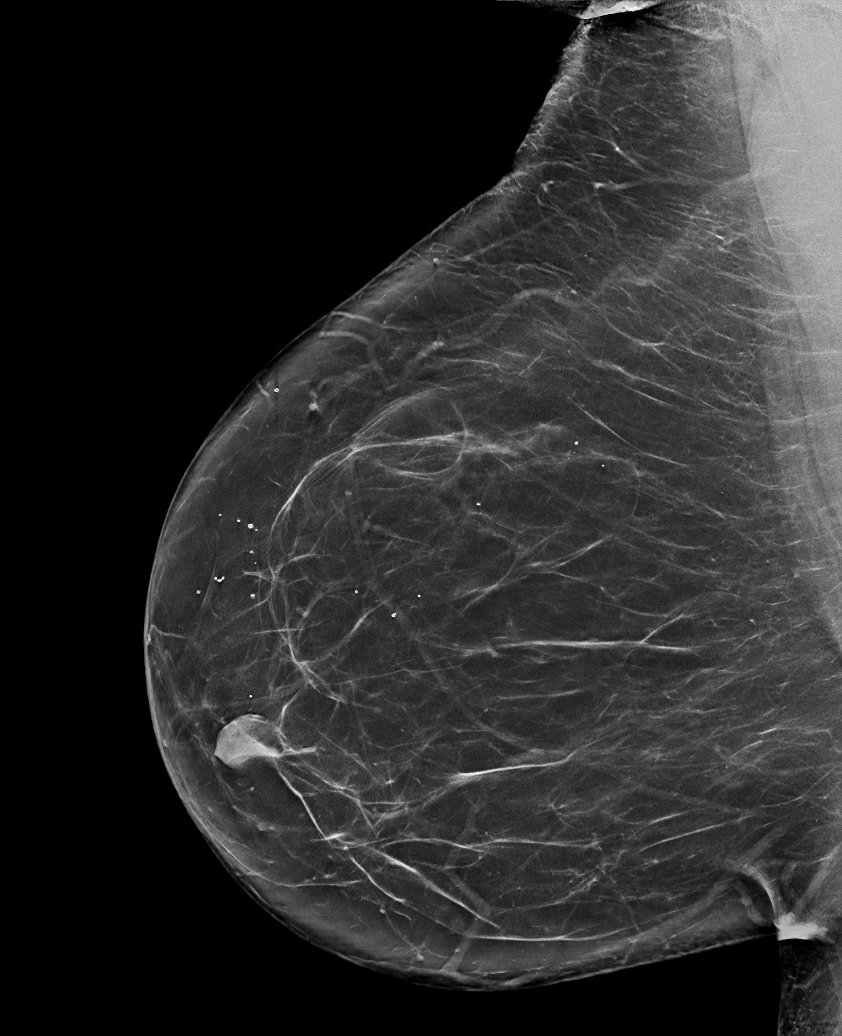

[L CC synth-2D]
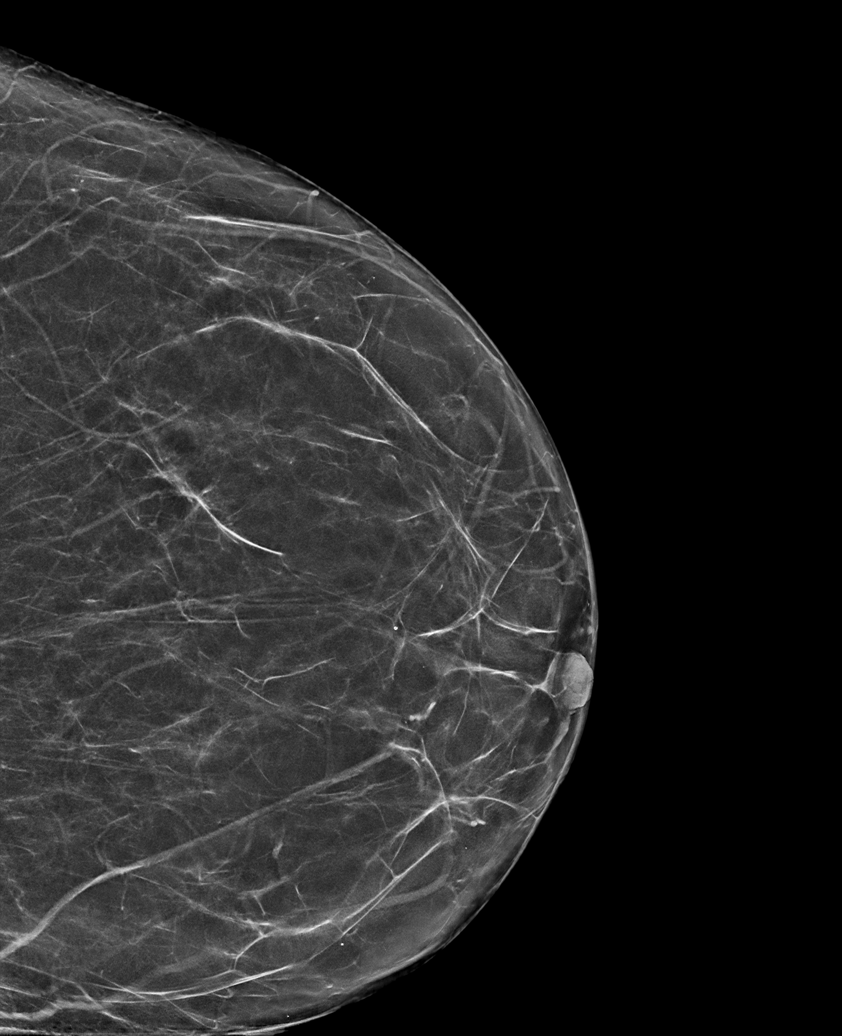

[R CC synth-2D (2 of 2)]
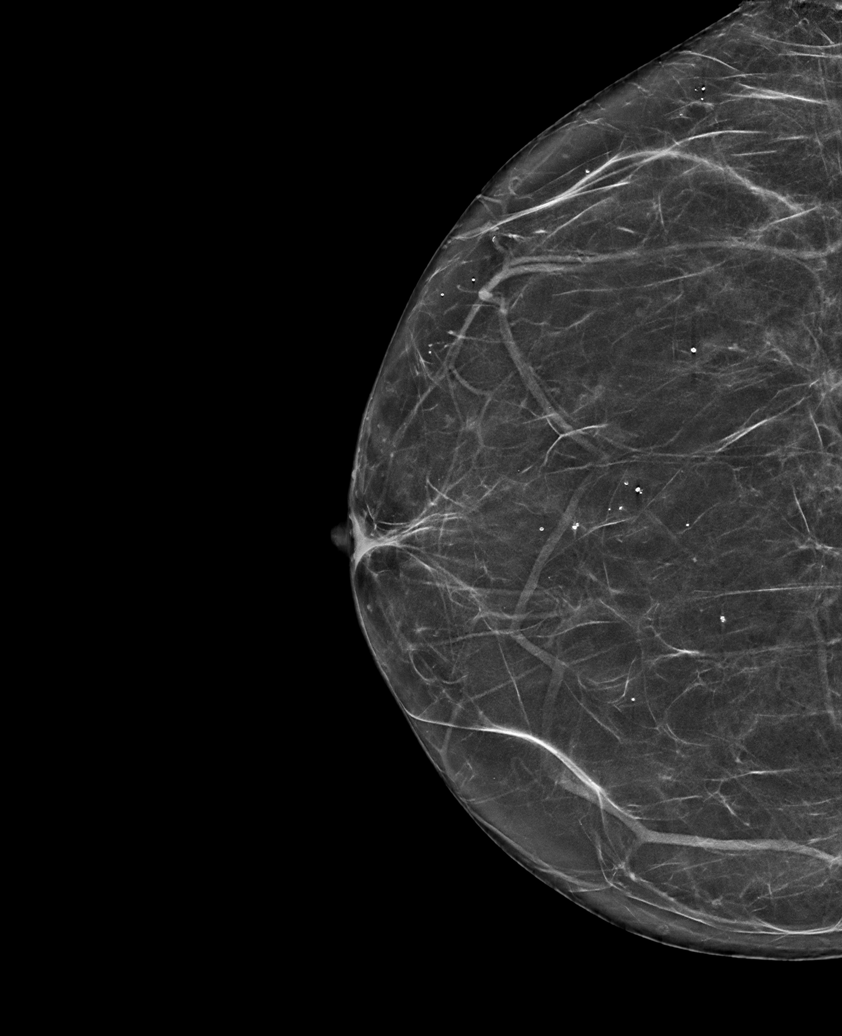

[6 of 36 positions shown; findings below may reference images not displayed]

ACR Breast Density Category b: There are scattered areas of
fibroglandular density.
FINDINGS: There are no findings suspicious for malignancy.
IMPRESSION: No mammographic evidence of malignancy. A result letter of this
screening mammogram will be mailed directly to the patient.

RECOMMENDATION:
Screening mammogram in one year. (Code:51-O-LD2)

BI-RADS CATEGORY  1: Negative.

## 2023-01-10 DIAGNOSIS — E785 Hyperlipidemia, unspecified: Secondary | ICD-10-CM | POA: Diagnosis not present

## 2023-01-17 ENCOUNTER — Other Ambulatory Visit (HOSPITAL_COMMUNITY): Payer: Self-pay

## 2023-01-17 DIAGNOSIS — G4700 Insomnia, unspecified: Secondary | ICD-10-CM | POA: Diagnosis not present

## 2023-01-17 DIAGNOSIS — M5412 Radiculopathy, cervical region: Secondary | ICD-10-CM | POA: Diagnosis not present

## 2023-01-17 DIAGNOSIS — G894 Chronic pain syndrome: Secondary | ICD-10-CM | POA: Diagnosis not present

## 2023-01-17 DIAGNOSIS — M47812 Spondylosis without myelopathy or radiculopathy, cervical region: Secondary | ICD-10-CM | POA: Diagnosis not present

## 2023-01-17 MED ORDER — TIZANIDINE HCL 2 MG PO TABS: 2.0000 mg | ORAL_TABLET | Freq: Three times a day (TID) | ORAL | 0 refills | Status: AC | PRN

## 2023-01-17 MED ORDER — OXYCODONE HCL 10 MG PO TABS
10.0000 mg | ORAL_TABLET | Freq: Four times a day (QID) | ORAL | 0 refills | Status: AC | PRN
  Filled 2023-01-17: qty 120, 30d supply, fill #0

## 2023-01-17 MED ORDER — GABAPENTIN 300 MG PO CAPS
600.0000 mg | ORAL_CAPSULE | Freq: Four times a day (QID) | ORAL | 0 refills | Status: DC
  Filled 2023-01-17: qty 720, 90d supply, fill #0

## 2023-02-09 DIAGNOSIS — E785 Hyperlipidemia, unspecified: Secondary | ICD-10-CM | POA: Diagnosis not present

## 2023-02-17 ENCOUNTER — Other Ambulatory Visit (HOSPITAL_COMMUNITY): Payer: Self-pay

## 2023-02-17 DIAGNOSIS — M47812 Spondylosis without myelopathy or radiculopathy, cervical region: Secondary | ICD-10-CM | POA: Diagnosis not present

## 2023-02-17 DIAGNOSIS — G894 Chronic pain syndrome: Secondary | ICD-10-CM | POA: Diagnosis not present

## 2023-02-17 DIAGNOSIS — G4700 Insomnia, unspecified: Secondary | ICD-10-CM | POA: Diagnosis not present

## 2023-02-17 DIAGNOSIS — M5412 Radiculopathy, cervical region: Secondary | ICD-10-CM | POA: Diagnosis not present

## 2023-02-17 MED ORDER — OXYCODONE HCL 10 MG PO TABS
10.0000 mg | ORAL_TABLET | Freq: Four times a day (QID) | ORAL | 0 refills | Status: AC | PRN
  Filled 2023-02-17: qty 120, 30d supply, fill #0

## 2023-02-18 DIAGNOSIS — Z23 Encounter for immunization: Secondary | ICD-10-CM | POA: Diagnosis not present

## 2023-02-24 ENCOUNTER — Other Ambulatory Visit (HOSPITAL_COMMUNITY): Payer: Self-pay

## 2023-02-24 MED ORDER — CITALOPRAM HYDROBROMIDE 40 MG PO TABS
40.0000 mg | ORAL_TABLET | Freq: Every day | ORAL | 0 refills | Status: AC
  Filled 2023-02-24: qty 90, 90d supply, fill #0

## 2023-03-03 ENCOUNTER — Other Ambulatory Visit (HOSPITAL_COMMUNITY): Payer: Self-pay

## 2023-03-03 DIAGNOSIS — R7303 Prediabetes: Secondary | ICD-10-CM | POA: Diagnosis not present

## 2023-03-12 DIAGNOSIS — E785 Hyperlipidemia, unspecified: Secondary | ICD-10-CM | POA: Diagnosis not present

## 2023-03-17 ENCOUNTER — Other Ambulatory Visit (HOSPITAL_COMMUNITY): Payer: Self-pay

## 2023-03-17 MED ORDER — OXYCODONE HCL 10 MG PO TABS
10.0000 mg | ORAL_TABLET | Freq: Four times a day (QID) | ORAL | 0 refills | Status: DC
  Filled 2023-03-17: qty 120, 30d supply, fill #0

## 2023-03-18 ENCOUNTER — Other Ambulatory Visit (HOSPITAL_COMMUNITY): Payer: Self-pay

## 2023-03-18 ENCOUNTER — Other Ambulatory Visit: Payer: Self-pay

## 2023-04-07 DIAGNOSIS — E785 Hyperlipidemia, unspecified: Secondary | ICD-10-CM | POA: Diagnosis not present

## 2023-04-07 DIAGNOSIS — R7303 Prediabetes: Secondary | ICD-10-CM | POA: Diagnosis not present

## 2023-04-07 DIAGNOSIS — I10 Essential (primary) hypertension: Secondary | ICD-10-CM | POA: Diagnosis not present

## 2023-04-09 DIAGNOSIS — G4733 Obstructive sleep apnea (adult) (pediatric): Secondary | ICD-10-CM | POA: Diagnosis not present

## 2023-04-10 DIAGNOSIS — E785 Hyperlipidemia, unspecified: Secondary | ICD-10-CM | POA: Diagnosis not present

## 2023-04-12 DIAGNOSIS — E785 Hyperlipidemia, unspecified: Secondary | ICD-10-CM | POA: Diagnosis not present

## 2023-04-15 ENCOUNTER — Other Ambulatory Visit: Payer: Self-pay

## 2023-04-15 DIAGNOSIS — R7303 Prediabetes: Secondary | ICD-10-CM

## 2023-04-16 DIAGNOSIS — R7303 Prediabetes: Secondary | ICD-10-CM | POA: Diagnosis not present

## 2023-04-16 DIAGNOSIS — E785 Hyperlipidemia, unspecified: Secondary | ICD-10-CM | POA: Diagnosis not present

## 2023-04-16 DIAGNOSIS — I10 Essential (primary) hypertension: Secondary | ICD-10-CM | POA: Diagnosis not present

## 2023-04-17 ENCOUNTER — Other Ambulatory Visit (HOSPITAL_COMMUNITY): Payer: Self-pay

## 2023-04-17 DIAGNOSIS — M47812 Spondylosis without myelopathy or radiculopathy, cervical region: Secondary | ICD-10-CM | POA: Diagnosis not present

## 2023-04-17 DIAGNOSIS — M5412 Radiculopathy, cervical region: Secondary | ICD-10-CM | POA: Diagnosis not present

## 2023-04-17 DIAGNOSIS — G4700 Insomnia, unspecified: Secondary | ICD-10-CM | POA: Diagnosis not present

## 2023-04-17 DIAGNOSIS — G894 Chronic pain syndrome: Secondary | ICD-10-CM | POA: Diagnosis not present

## 2023-04-17 MED ORDER — GABAPENTIN 300 MG PO CAPS
600.0000 mg | ORAL_CAPSULE | Freq: Four times a day (QID) | ORAL | 0 refills | Status: DC
  Filled 2023-04-17: qty 720, 90d supply, fill #0

## 2023-04-17 MED ORDER — TIZANIDINE HCL 2 MG PO TABS
ORAL_TABLET | Freq: Three times a day (TID) | ORAL | 0 refills | Status: AC
  Filled 2023-04-17: qty 540, 90d supply, fill #0

## 2023-04-17 MED ORDER — OXYCODONE HCL 10 MG PO TABS
10.0000 mg | ORAL_TABLET | Freq: Four times a day (QID) | ORAL | 0 refills | Status: AC
  Filled 2023-04-17: qty 120, 30d supply, fill #0

## 2023-04-23 DIAGNOSIS — I10 Essential (primary) hypertension: Secondary | ICD-10-CM | POA: Diagnosis not present

## 2023-04-23 DIAGNOSIS — R7303 Prediabetes: Secondary | ICD-10-CM | POA: Diagnosis not present

## 2023-04-23 DIAGNOSIS — E785 Hyperlipidemia, unspecified: Secondary | ICD-10-CM | POA: Diagnosis not present

## 2023-04-28 ENCOUNTER — Other Ambulatory Visit (HOSPITAL_COMMUNITY): Payer: Self-pay

## 2023-04-28 DIAGNOSIS — R7303 Prediabetes: Secondary | ICD-10-CM | POA: Diagnosis not present

## 2023-04-28 DIAGNOSIS — E785 Hyperlipidemia, unspecified: Secondary | ICD-10-CM | POA: Diagnosis not present

## 2023-04-29 ENCOUNTER — Encounter: Payer: Self-pay | Admitting: Pharmacist

## 2023-04-29 NOTE — Progress Notes (Signed)
   04/29/2023  Patient ID: Dana Hines, female   DOB: 1965/10/03, 58 y.o.   MRN: [id number]  I have communicated to Kendrick Fries, LPN at Dr. Yetta Flock office that referral for medication assistance for [facility] is unactionable at this time. Medicare does not cover GLP-1 for weight loss. Patient does not carry a qualifying diagnosis for GLP-1 at this time. There are no patient assistance programs at this time for weight loss medications for recipients of Medicare.   Referral will be closed.   Reynold Bowen, PharmD Clinical Pharmacist [place] Direct Dial: [phone]

## 2023-05-10 DIAGNOSIS — E785 Hyperlipidemia, unspecified: Secondary | ICD-10-CM | POA: Diagnosis not present

## 2023-05-12 DIAGNOSIS — E785 Hyperlipidemia, unspecified: Secondary | ICD-10-CM | POA: Diagnosis not present

## 2023-05-12 DIAGNOSIS — I10 Essential (primary) hypertension: Secondary | ICD-10-CM | POA: Diagnosis not present

## 2023-05-12 DIAGNOSIS — R7303 Prediabetes: Secondary | ICD-10-CM | POA: Diagnosis not present

## 2023-05-13 ENCOUNTER — Other Ambulatory Visit (HOSPITAL_COMMUNITY): Payer: Self-pay

## 2023-05-13 DIAGNOSIS — R202 Paresthesia of skin: Secondary | ICD-10-CM | POA: Diagnosis not present

## 2023-05-13 DIAGNOSIS — R600 Localized edema: Secondary | ICD-10-CM | POA: Diagnosis not present

## 2023-05-13 MED ORDER — FUROSEMIDE 20 MG PO TABS
20.0000 mg | ORAL_TABLET | Freq: Every day | ORAL | 0 refills | Status: AC
  Filled 2023-05-13 (×2): qty 30, 30d supply, fill #0

## 2023-05-15 ENCOUNTER — Other Ambulatory Visit (HOSPITAL_COMMUNITY): Payer: Self-pay

## 2023-05-15 DIAGNOSIS — M47812 Spondylosis without myelopathy or radiculopathy, cervical region: Secondary | ICD-10-CM | POA: Diagnosis not present

## 2023-05-15 DIAGNOSIS — G894 Chronic pain syndrome: Secondary | ICD-10-CM | POA: Diagnosis not present

## 2023-05-15 DIAGNOSIS — G4700 Insomnia, unspecified: Secondary | ICD-10-CM | POA: Diagnosis not present

## 2023-05-15 DIAGNOSIS — M5412 Radiculopathy, cervical region: Secondary | ICD-10-CM | POA: Diagnosis not present

## 2023-05-15 MED ORDER — OXYCODONE HCL 10 MG PO TABS
10.0000 mg | ORAL_TABLET | Freq: Four times a day (QID) | ORAL | 0 refills | Status: DC | PRN
  Filled 2023-05-15: qty 120, 30d supply, fill #0

## 2023-05-16 DIAGNOSIS — R600 Localized edema: Secondary | ICD-10-CM | POA: Diagnosis not present

## 2023-05-22 ENCOUNTER — Other Ambulatory Visit (HOSPITAL_COMMUNITY): Payer: Self-pay

## 2023-05-22 DIAGNOSIS — R0609 Other forms of dyspnea: Secondary | ICD-10-CM | POA: Diagnosis not present

## 2023-05-22 DIAGNOSIS — G4733 Obstructive sleep apnea (adult) (pediatric): Secondary | ICD-10-CM | POA: Diagnosis not present

## 2023-05-22 MED ORDER — ALBUTEROL SULFATE HFA 108 (90 BASE) MCG/ACT IN AERS
2.0000 | INHALATION_SPRAY | Freq: Four times a day (QID) | RESPIRATORY_TRACT | 0 refills | Status: AC | PRN
  Filled 2023-05-22: qty 18, 25d supply, fill #0

## 2023-05-27 ENCOUNTER — Other Ambulatory Visit (HOSPITAL_COMMUNITY): Payer: Self-pay

## 2023-05-27 DIAGNOSIS — R609 Edema, unspecified: Secondary | ICD-10-CM | POA: Diagnosis not present

## 2023-05-27 DIAGNOSIS — G4733 Obstructive sleep apnea (adult) (pediatric): Secondary | ICD-10-CM | POA: Diagnosis not present

## 2023-05-27 MED ORDER — TRIAMTERENE-HCTZ 37.5-25 MG PO TABS
1.0000 | ORAL_TABLET | Freq: Every day | ORAL | 3 refills | Status: AC
  Filled 2023-05-27: qty 30, 30d supply, fill #0

## 2023-06-10 DIAGNOSIS — K760 Fatty (change of) liver, not elsewhere classified: Secondary | ICD-10-CM | POA: Diagnosis not present

## 2023-06-12 ENCOUNTER — Other Ambulatory Visit (HOSPITAL_COMMUNITY): Payer: Self-pay

## 2023-06-12 DIAGNOSIS — G894 Chronic pain syndrome: Secondary | ICD-10-CM | POA: Diagnosis not present

## 2023-06-12 DIAGNOSIS — M47812 Spondylosis without myelopathy or radiculopathy, cervical region: Secondary | ICD-10-CM | POA: Diagnosis not present

## 2023-06-12 DIAGNOSIS — M5412 Radiculopathy, cervical region: Secondary | ICD-10-CM | POA: Diagnosis not present

## 2023-06-12 DIAGNOSIS — G4700 Insomnia, unspecified: Secondary | ICD-10-CM | POA: Diagnosis not present

## 2023-06-12 MED ORDER — OXYCODONE HCL 10 MG PO TABS
10.0000 mg | ORAL_TABLET | Freq: Four times a day (QID) | ORAL | 0 refills | Status: DC | PRN
  Filled 2023-06-12: qty 120, 30d supply, fill #0

## 2023-06-25 ENCOUNTER — Other Ambulatory Visit (HOSPITAL_COMMUNITY): Payer: Self-pay

## 2023-06-25 MED ORDER — CITALOPRAM HYDROBROMIDE 40 MG PO TABS
40.0000 mg | ORAL_TABLET | Freq: Every day | ORAL | 0 refills | Status: AC
  Filled 2023-06-25: qty 90, 90d supply, fill #0

## 2023-06-25 MED ORDER — ARIPIPRAZOLE 5 MG PO TABS
5.0000 mg | ORAL_TABLET | Freq: Every day | ORAL | 0 refills | Status: DC
  Filled 2023-06-25: qty 90, 90d supply, fill #0

## 2023-06-25 MED ORDER — ATORVASTATIN CALCIUM 10 MG PO TABS
10.0000 mg | ORAL_TABLET | Freq: Every day | ORAL | 1 refills | Status: DC
  Filled 2023-06-25: qty 90, 90d supply, fill #0
  Filled 2024-01-13: qty 90, 90d supply, fill #1

## 2023-06-25 MED ORDER — AMLODIPINE BESYLATE 5 MG PO TABS
5.0000 mg | ORAL_TABLET | Freq: Every day | ORAL | 0 refills | Status: DC
  Filled 2023-06-25: qty 90, 90d supply, fill #0

## 2023-06-28 ENCOUNTER — Other Ambulatory Visit (HOSPITAL_COMMUNITY): Payer: Self-pay

## 2023-07-05 DIAGNOSIS — G4730 Sleep apnea, unspecified: Secondary | ICD-10-CM | POA: Diagnosis not present

## 2023-07-09 DIAGNOSIS — G4733 Obstructive sleep apnea (adult) (pediatric): Secondary | ICD-10-CM | POA: Diagnosis not present

## 2023-07-10 ENCOUNTER — Other Ambulatory Visit (HOSPITAL_COMMUNITY): Payer: Self-pay

## 2023-07-10 DIAGNOSIS — M5412 Radiculopathy, cervical region: Secondary | ICD-10-CM | POA: Diagnosis not present

## 2023-07-10 DIAGNOSIS — M47812 Spondylosis without myelopathy or radiculopathy, cervical region: Secondary | ICD-10-CM | POA: Diagnosis not present

## 2023-07-10 DIAGNOSIS — K760 Fatty (change of) liver, not elsewhere classified: Secondary | ICD-10-CM | POA: Diagnosis not present

## 2023-07-10 DIAGNOSIS — G894 Chronic pain syndrome: Secondary | ICD-10-CM | POA: Diagnosis not present

## 2023-07-10 DIAGNOSIS — G4700 Insomnia, unspecified: Secondary | ICD-10-CM | POA: Diagnosis not present

## 2023-07-10 MED ORDER — GABAPENTIN 300 MG PO CAPS
600.0000 mg | ORAL_CAPSULE | Freq: Four times a day (QID) | ORAL | 0 refills | Status: DC
  Filled 2023-07-10: qty 720, 90d supply, fill #0

## 2023-07-10 MED ORDER — OXYCODONE HCL 10 MG PO TABS
10.0000 mg | ORAL_TABLET | Freq: Four times a day (QID) | ORAL | 0 refills | Status: AC | PRN
  Filled 2023-07-10: qty 120, 30d supply, fill #0

## 2023-07-21 DIAGNOSIS — R7303 Prediabetes: Secondary | ICD-10-CM | POA: Diagnosis not present

## 2023-07-21 DIAGNOSIS — I10 Essential (primary) hypertension: Secondary | ICD-10-CM | POA: Diagnosis not present

## 2023-07-21 DIAGNOSIS — E785 Hyperlipidemia, unspecified: Secondary | ICD-10-CM | POA: Diagnosis not present

## 2023-08-01 ENCOUNTER — Other Ambulatory Visit (HOSPITAL_COMMUNITY): Payer: Self-pay

## 2023-08-05 ENCOUNTER — Other Ambulatory Visit (HOSPITAL_COMMUNITY): Payer: Self-pay

## 2023-08-05 ENCOUNTER — Other Ambulatory Visit: Payer: Self-pay

## 2023-08-05 DIAGNOSIS — Z136 Encounter for screening for cardiovascular disorders: Secondary | ICD-10-CM | POA: Diagnosis not present

## 2023-08-05 DIAGNOSIS — Z139 Encounter for screening, unspecified: Secondary | ICD-10-CM | POA: Diagnosis not present

## 2023-08-05 DIAGNOSIS — M5412 Radiculopathy, cervical region: Secondary | ICD-10-CM | POA: Diagnosis not present

## 2023-08-05 DIAGNOSIS — G894 Chronic pain syndrome: Secondary | ICD-10-CM | POA: Diagnosis not present

## 2023-08-05 DIAGNOSIS — G4700 Insomnia, unspecified: Secondary | ICD-10-CM | POA: Diagnosis not present

## 2023-08-05 DIAGNOSIS — M47812 Spondylosis without myelopathy or radiculopathy, cervical region: Secondary | ICD-10-CM | POA: Diagnosis not present

## 2023-08-05 DIAGNOSIS — I10 Essential (primary) hypertension: Secondary | ICD-10-CM | POA: Diagnosis not present

## 2023-08-05 DIAGNOSIS — Z0000 Encounter for general adult medical examination without abnormal findings: Secondary | ICD-10-CM | POA: Diagnosis not present

## 2023-08-05 DIAGNOSIS — E785 Hyperlipidemia, unspecified: Secondary | ICD-10-CM | POA: Diagnosis not present

## 2023-08-05 DIAGNOSIS — E1169 Type 2 diabetes mellitus with other specified complication: Secondary | ICD-10-CM | POA: Diagnosis not present

## 2023-08-05 MED ORDER — NYSTATIN 100000 UNIT/GM EX POWD
CUTANEOUS | 2 refills | Status: AC
  Filled 2023-08-05: qty 15, 15d supply, fill #0
  Filled 2024-01-13: qty 15, 15d supply, fill #1
  Filled 2024-03-20: qty 15, 15d supply, fill #2

## 2023-08-05 MED ORDER — AIRSUPRA 90-80 MCG/ACT IN AERO
2.0000 | INHALATION_SPRAY | Freq: Four times a day (QID) | RESPIRATORY_TRACT | 2 refills | Status: AC | PRN
  Filled 2023-08-05: qty 10.7, 15d supply, fill #0
  Filled 2024-01-13: qty 10.7, 15d supply, fill #1
  Filled 2024-04-13: qty 10.7, 15d supply, fill #2

## 2023-08-05 MED ORDER — OXYCODONE HCL 10 MG PO TABS
10.0000 mg | ORAL_TABLET | Freq: Four times a day (QID) | ORAL | 0 refills | Status: DC | PRN
  Filled 2023-08-07 (×2): qty 120, 30d supply, fill #0

## 2023-08-05 MED ORDER — OZEMPIC (0.25 OR 0.5 MG/DOSE) 2 MG/3ML ~~LOC~~ SOPN
0.2500 mg | PEN_INJECTOR | SUBCUTANEOUS | 1 refills | Status: DC
  Filled 2023-08-05: qty 3, 56d supply, fill #0
  Filled 2023-11-21: qty 3, 56d supply, fill #1

## 2023-08-07 ENCOUNTER — Other Ambulatory Visit (HOSPITAL_COMMUNITY): Payer: Self-pay

## 2023-08-07 ENCOUNTER — Other Ambulatory Visit: Payer: Self-pay

## 2023-08-10 DIAGNOSIS — K760 Fatty (change of) liver, not elsewhere classified: Secondary | ICD-10-CM | POA: Diagnosis not present

## 2023-09-01 ENCOUNTER — Other Ambulatory Visit (HOSPITAL_COMMUNITY): Payer: Self-pay

## 2023-09-08 ENCOUNTER — Other Ambulatory Visit (HOSPITAL_COMMUNITY): Payer: Self-pay

## 2023-09-08 DIAGNOSIS — G4700 Insomnia, unspecified: Secondary | ICD-10-CM | POA: Diagnosis not present

## 2023-09-08 DIAGNOSIS — M47812 Spondylosis without myelopathy or radiculopathy, cervical region: Secondary | ICD-10-CM | POA: Diagnosis not present

## 2023-09-08 DIAGNOSIS — G894 Chronic pain syndrome: Secondary | ICD-10-CM | POA: Diagnosis not present

## 2023-09-08 DIAGNOSIS — M5412 Radiculopathy, cervical region: Secondary | ICD-10-CM | POA: Diagnosis not present

## 2023-09-08 MED ORDER — OXYCODONE HCL 10 MG PO TABS
10.0000 mg | ORAL_TABLET | Freq: Four times a day (QID) | ORAL | 0 refills | Status: DC | PRN
  Filled 2023-09-08: qty 120, 30d supply, fill #0

## 2023-09-09 DIAGNOSIS — K760 Fatty (change of) liver, not elsewhere classified: Secondary | ICD-10-CM | POA: Diagnosis not present

## 2023-09-10 DIAGNOSIS — E119 Type 2 diabetes mellitus without complications: Secondary | ICD-10-CM | POA: Diagnosis not present

## 2023-09-15 ENCOUNTER — Other Ambulatory Visit: Payer: Self-pay | Admitting: Family Medicine

## 2023-09-15 DIAGNOSIS — Z1231 Encounter for screening mammogram for malignant neoplasm of breast: Secondary | ICD-10-CM

## 2023-09-17 ENCOUNTER — Ambulatory Visit
Admission: RE | Admit: 2023-09-17 | Discharge: 2023-09-17 | Disposition: A | Discharge status: Home / Self Care | Source: Ambulatory Visit | Attending: Family Medicine | Admitting: Family Medicine

## 2023-09-17 DIAGNOSIS — Z1231 Encounter for screening mammogram for malignant neoplasm of breast: Secondary | ICD-10-CM | POA: Diagnosis not present

## 2023-10-06 ENCOUNTER — Other Ambulatory Visit (HOSPITAL_COMMUNITY): Payer: Self-pay

## 2023-10-06 ENCOUNTER — Other Ambulatory Visit: Payer: Self-pay

## 2023-10-06 DIAGNOSIS — G894 Chronic pain syndrome: Secondary | ICD-10-CM | POA: Diagnosis not present

## 2023-10-06 DIAGNOSIS — G4700 Insomnia, unspecified: Secondary | ICD-10-CM | POA: Diagnosis not present

## 2023-10-06 DIAGNOSIS — M5412 Radiculopathy, cervical region: Secondary | ICD-10-CM | POA: Diagnosis not present

## 2023-10-06 DIAGNOSIS — M47812 Spondylosis without myelopathy or radiculopathy, cervical region: Secondary | ICD-10-CM | POA: Diagnosis not present

## 2023-10-06 MED ORDER — GABAPENTIN 300 MG PO CAPS
600.0000 mg | ORAL_CAPSULE | Freq: Three times a day (TID) | ORAL | 0 refills | Status: DC
  Filled 2023-10-06 (×2): qty 180, 30d supply, fill #0

## 2023-10-06 MED ORDER — OXYCODONE HCL 10 MG PO TABS
10.0000 mg | ORAL_TABLET | Freq: Four times a day (QID) | ORAL | 0 refills | Status: DC | PRN
  Filled 2023-10-06 (×2): qty 120, 30d supply, fill #0

## 2023-10-10 DIAGNOSIS — K760 Fatty (change of) liver, not elsewhere classified: Secondary | ICD-10-CM | POA: Diagnosis not present

## 2023-10-13 DIAGNOSIS — G4733 Obstructive sleep apnea (adult) (pediatric): Secondary | ICD-10-CM | POA: Diagnosis not present

## 2023-10-16 ENCOUNTER — Other Ambulatory Visit (HOSPITAL_COMMUNITY): Payer: Self-pay

## 2023-10-16 MED ORDER — CITALOPRAM HYDROBROMIDE 40 MG PO TABS
40.0000 mg | ORAL_TABLET | Freq: Every day | ORAL | 1 refills | Status: AC
  Filled 2023-10-16: qty 90, 90d supply, fill #0
  Filled 2024-01-13: qty 90, 90d supply, fill #1

## 2023-10-22 ENCOUNTER — Other Ambulatory Visit (HOSPITAL_COMMUNITY): Payer: Self-pay

## 2023-10-22 MED ORDER — LISINOPRIL 10 MG PO TABS
10.0000 mg | ORAL_TABLET | Freq: Every day | ORAL | 0 refills | Status: DC
  Filled 2023-10-22: qty 90, 90d supply, fill #0

## 2023-10-22 MED ORDER — TRIAMTERENE-HCTZ 37.5-25 MG PO TABS
1.0000 | ORAL_TABLET | Freq: Every day | ORAL | 1 refills | Status: AC
  Filled 2023-10-22: qty 90, 90d supply, fill #0
  Filled 2024-01-13: qty 90, 90d supply, fill #1

## 2023-10-31 DIAGNOSIS — E785 Hyperlipidemia, unspecified: Secondary | ICD-10-CM | POA: Diagnosis not present

## 2023-10-31 DIAGNOSIS — R7303 Prediabetes: Secondary | ICD-10-CM | POA: Diagnosis not present

## 2023-11-03 ENCOUNTER — Other Ambulatory Visit (HOSPITAL_COMMUNITY): Payer: Self-pay

## 2023-11-03 DIAGNOSIS — G4700 Insomnia, unspecified: Secondary | ICD-10-CM | POA: Diagnosis not present

## 2023-11-03 DIAGNOSIS — G894 Chronic pain syndrome: Secondary | ICD-10-CM | POA: Diagnosis not present

## 2023-11-03 DIAGNOSIS — Z79891 Long term (current) use of opiate analgesic: Secondary | ICD-10-CM | POA: Diagnosis not present

## 2023-11-03 DIAGNOSIS — M47812 Spondylosis without myelopathy or radiculopathy, cervical region: Secondary | ICD-10-CM | POA: Diagnosis not present

## 2023-11-03 DIAGNOSIS — M5412 Radiculopathy, cervical region: Secondary | ICD-10-CM | POA: Diagnosis not present

## 2023-11-03 MED ORDER — GABAPENTIN 300 MG PO CAPS
600.0000 mg | ORAL_CAPSULE | Freq: Every evening | ORAL | 0 refills | Status: DC
  Filled 2023-11-03: qty 60, 30d supply, fill #0

## 2023-11-03 MED ORDER — OXYCODONE HCL 10 MG PO TABS
10.0000 mg | ORAL_TABLET | Freq: Four times a day (QID) | ORAL | 0 refills | Status: AC | PRN
  Filled 2023-11-03: qty 120, 30d supply, fill #0

## 2023-11-10 DIAGNOSIS — K760 Fatty (change of) liver, not elsewhere classified: Secondary | ICD-10-CM | POA: Diagnosis not present

## 2023-11-21 ENCOUNTER — Other Ambulatory Visit (HOSPITAL_COMMUNITY): Payer: Self-pay

## 2023-11-26 ENCOUNTER — Other Ambulatory Visit (HOSPITAL_BASED_OUTPATIENT_CLINIC_OR_DEPARTMENT_OTHER): Payer: Self-pay | Admitting: Physician Assistant

## 2023-11-26 ENCOUNTER — Other Ambulatory Visit (HOSPITAL_COMMUNITY): Payer: Self-pay

## 2023-11-26 ENCOUNTER — Ambulatory Visit (INDEPENDENT_AMBULATORY_CARE_PROVIDER_SITE_OTHER)
Admission: RE | Admit: 2023-11-26 | Discharge: 2023-11-26 | Disposition: A | Discharge status: Home / Self Care | Source: Ambulatory Visit | Attending: Physician Assistant | Admitting: Physician Assistant

## 2023-11-26 DIAGNOSIS — M545 Low back pain, unspecified: Secondary | ICD-10-CM | POA: Diagnosis not present

## 2023-11-26 DIAGNOSIS — M25551 Pain in right hip: Secondary | ICD-10-CM | POA: Diagnosis not present

## 2023-11-26 DIAGNOSIS — R5383 Other fatigue: Secondary | ICD-10-CM | POA: Diagnosis not present

## 2023-11-26 DIAGNOSIS — M25552 Pain in left hip: Secondary | ICD-10-CM

## 2023-11-26 DIAGNOSIS — Q742 Other congenital malformations of lower limb(s), including pelvic girdle: Secondary | ICD-10-CM | POA: Diagnosis not present

## 2023-11-26 DIAGNOSIS — G2581 Restless legs syndrome: Secondary | ICD-10-CM | POA: Diagnosis not present

## 2023-11-26 DIAGNOSIS — Q6589 Other specified congenital deformities of hip: Secondary | ICD-10-CM | POA: Diagnosis not present

## 2023-11-26 MED ORDER — ROPINIROLE HCL 0.25 MG PO TABS
0.2500 mg | ORAL_TABLET | Freq: Every day | ORAL | 1 refills | Status: DC
  Filled 2023-11-26: qty 30, 30d supply, fill #0
  Filled 2023-12-23: qty 30, 30d supply, fill #1

## 2023-11-26 MED ORDER — ARIPIPRAZOLE 5 MG PO TABS
5.0000 mg | ORAL_TABLET | Freq: Every day | ORAL | 1 refills | Status: AC
  Filled 2023-11-26: qty 90, 90d supply, fill #0
  Filled 2024-01-13 – 2024-02-22 (×2): qty 90, 90d supply, fill #1

## 2023-11-27 ENCOUNTER — Other Ambulatory Visit: Payer: Self-pay

## 2023-12-01 ENCOUNTER — Other Ambulatory Visit (HOSPITAL_COMMUNITY): Payer: Self-pay

## 2023-12-01 DIAGNOSIS — G894 Chronic pain syndrome: Secondary | ICD-10-CM | POA: Diagnosis not present

## 2023-12-01 DIAGNOSIS — M5412 Radiculopathy, cervical region: Secondary | ICD-10-CM | POA: Diagnosis not present

## 2023-12-01 DIAGNOSIS — M47812 Spondylosis without myelopathy or radiculopathy, cervical region: Secondary | ICD-10-CM | POA: Diagnosis not present

## 2023-12-01 DIAGNOSIS — G4700 Insomnia, unspecified: Secondary | ICD-10-CM | POA: Diagnosis not present

## 2023-12-01 MED ORDER — GABAPENTIN 300 MG PO CAPS
600.0000 mg | ORAL_CAPSULE | Freq: Every evening | ORAL | 0 refills | Status: DC
  Filled 2023-12-01: qty 60, 30d supply, fill #0

## 2023-12-01 MED ORDER — OXYCODONE HCL 10 MG PO TABS
10.0000 mg | ORAL_TABLET | Freq: Four times a day (QID) | ORAL | 0 refills | Status: AC
  Filled 2023-12-01: qty 120, 30d supply, fill #0

## 2023-12-10 DIAGNOSIS — K760 Fatty (change of) liver, not elsewhere classified: Secondary | ICD-10-CM | POA: Diagnosis not present

## 2023-12-30 DIAGNOSIS — E119 Type 2 diabetes mellitus without complications: Secondary | ICD-10-CM | POA: Diagnosis not present

## 2023-12-31 ENCOUNTER — Other Ambulatory Visit (HOSPITAL_COMMUNITY): Payer: Self-pay

## 2023-12-31 ENCOUNTER — Other Ambulatory Visit: Payer: Self-pay

## 2023-12-31 DIAGNOSIS — G4700 Insomnia, unspecified: Secondary | ICD-10-CM | POA: Diagnosis not present

## 2023-12-31 DIAGNOSIS — M47812 Spondylosis without myelopathy or radiculopathy, cervical region: Secondary | ICD-10-CM | POA: Diagnosis not present

## 2023-12-31 DIAGNOSIS — M5412 Radiculopathy, cervical region: Secondary | ICD-10-CM | POA: Diagnosis not present

## 2023-12-31 DIAGNOSIS — G894 Chronic pain syndrome: Secondary | ICD-10-CM | POA: Diagnosis not present

## 2023-12-31 MED ORDER — OXYCODONE HCL 10 MG PO TABS
10.0000 mg | ORAL_TABLET | Freq: Four times a day (QID) | ORAL | 0 refills | Status: AC | PRN
  Filled 2023-12-31 (×3): qty 120, 30d supply, fill #0

## 2023-12-31 MED ORDER — GABAPENTIN 300 MG PO CAPS
600.0000 mg | ORAL_CAPSULE | Freq: Every evening | ORAL | 0 refills | Status: DC
  Filled 2023-12-31: qty 60, 30d supply, fill #0
  Filled 2023-12-31: qty 60, 60d supply, fill #0

## 2024-01-10 DIAGNOSIS — K760 Fatty (change of) liver, not elsewhere classified: Secondary | ICD-10-CM | POA: Diagnosis not present

## 2024-01-13 ENCOUNTER — Other Ambulatory Visit (HOSPITAL_COMMUNITY): Payer: Self-pay

## 2024-01-14 ENCOUNTER — Other Ambulatory Visit (HOSPITAL_COMMUNITY): Payer: Self-pay

## 2024-01-14 ENCOUNTER — Other Ambulatory Visit: Payer: Self-pay

## 2024-01-14 MED ORDER — SEMAGLUTIDE(0.25 OR 0.5MG/DOS) 2 MG/3ML ~~LOC~~ SOPN
0.2500 mg | PEN_INJECTOR | SUBCUTANEOUS | 2 refills | Status: AC
  Filled 2024-01-14: qty 3, 56d supply, fill #0
  Filled 2024-03-20: qty 3, 56d supply, fill #1

## 2024-01-14 MED ORDER — LISINOPRIL 10 MG PO TABS
10.0000 mg | ORAL_TABLET | Freq: Every day | ORAL | 1 refills | Status: AC
  Filled 2024-01-14: qty 90, 90d supply, fill #0

## 2024-01-14 MED ORDER — ROPINIROLE HCL 0.25 MG PO TABS
0.2500 mg | ORAL_TABLET | Freq: Every day | ORAL | 2 refills | Status: AC
  Filled 2024-01-14 – 2024-01-15 (×2): qty 30, 30d supply, fill #0
  Filled 2024-03-10: qty 30, 30d supply, fill #1
  Filled 2024-04-10: qty 30, 30d supply, fill #2

## 2024-01-15 ENCOUNTER — Other Ambulatory Visit (HOSPITAL_COMMUNITY): Payer: Self-pay

## 2024-01-15 ENCOUNTER — Other Ambulatory Visit: Payer: Self-pay

## 2024-01-24 ENCOUNTER — Other Ambulatory Visit (HOSPITAL_COMMUNITY): Payer: Self-pay

## 2024-01-28 ENCOUNTER — Other Ambulatory Visit: Payer: Self-pay

## 2024-01-28 ENCOUNTER — Other Ambulatory Visit (HOSPITAL_COMMUNITY): Payer: Self-pay

## 2024-01-28 MED ORDER — GABAPENTIN 300 MG PO CAPS
600.0000 mg | ORAL_CAPSULE | Freq: Every evening | ORAL | 0 refills | Status: DC
  Filled 2024-01-28 (×3): qty 60, 30d supply, fill #0

## 2024-01-28 MED ORDER — OXYCODONE HCL 10 MG PO TABS
10.0000 mg | ORAL_TABLET | Freq: Four times a day (QID) | ORAL | 0 refills | Status: AC
  Filled 2024-01-28 (×2): qty 120, 30d supply, fill #0

## 2024-02-22 ENCOUNTER — Other Ambulatory Visit (HOSPITAL_COMMUNITY): Payer: Self-pay

## 2024-02-23 ENCOUNTER — Other Ambulatory Visit: Payer: Self-pay

## 2024-02-24 ENCOUNTER — Other Ambulatory Visit (HOSPITAL_COMMUNITY): Payer: Self-pay

## 2024-02-24 ENCOUNTER — Other Ambulatory Visit: Payer: Self-pay

## 2024-02-24 MED ORDER — AMLODIPINE BESYLATE 5 MG PO TABS
5.0000 mg | ORAL_TABLET | Freq: Every day | ORAL | 1 refills | Status: AC
  Filled 2024-02-24: qty 90, 90d supply, fill #0

## 2024-02-24 MED ORDER — OZEMPIC (0.25 OR 0.5 MG/DOSE) 2 MG/3ML ~~LOC~~ SOPN
0.5000 mg | PEN_INJECTOR | SUBCUTANEOUS | 2 refills | Status: AC
  Filled 2024-02-24: qty 3, 28d supply, fill #0
  Filled 2024-03-20: qty 3, 28d supply, fill #1

## 2024-02-24 MED ORDER — ATORVASTATIN CALCIUM 10 MG PO TABS
10.0000 mg | ORAL_TABLET | Freq: Every day | ORAL | 1 refills | Status: AC
  Filled 2024-02-24: qty 90, 90d supply, fill #0

## 2024-02-25 ENCOUNTER — Other Ambulatory Visit (HOSPITAL_COMMUNITY): Payer: Self-pay | Admitting: Physician Assistant

## 2024-02-25 DIAGNOSIS — R200 Anesthesia of skin: Secondary | ICD-10-CM

## 2024-02-25 DIAGNOSIS — R251 Tremor, unspecified: Secondary | ICD-10-CM

## 2024-02-26 ENCOUNTER — Other Ambulatory Visit (HOSPITAL_COMMUNITY): Payer: Self-pay

## 2024-02-26 MED ORDER — OXYCODONE HCL 10 MG PO TABS
10.0000 mg | ORAL_TABLET | Freq: Four times a day (QID) | ORAL | 0 refills | Status: DC | PRN
  Filled 2024-02-26: qty 120, 30d supply, fill #0

## 2024-02-26 MED ORDER — GABAPENTIN 300 MG PO CAPS
600.0000 mg | ORAL_CAPSULE | Freq: Every day | ORAL | 3 refills | Status: AC
  Filled 2024-02-26: qty 60, 30d supply, fill #0

## 2024-02-26 MED ORDER — LISINOPRIL 20 MG PO TABS
20.0000 mg | ORAL_TABLET | Freq: Every day | ORAL | 1 refills | Status: AC
  Filled 2024-02-26: qty 90, 90d supply, fill #0

## 2024-03-01 ENCOUNTER — Other Ambulatory Visit (HOSPITAL_COMMUNITY): Payer: Self-pay

## 2024-03-02 ENCOUNTER — Ambulatory Visit (HOSPITAL_COMMUNITY)
Admission: RE | Admit: 2024-03-02 | Discharge: 2024-03-02 | Disposition: A | Discharge status: Home / Self Care | Source: Ambulatory Visit | Attending: Physician Assistant | Admitting: Physician Assistant

## 2024-03-02 ENCOUNTER — Other Ambulatory Visit (HOSPITAL_COMMUNITY): Payer: Self-pay

## 2024-03-02 DIAGNOSIS — R200 Anesthesia of skin: Secondary | ICD-10-CM | POA: Diagnosis present

## 2024-03-02 DIAGNOSIS — Z8673 Personal history of transient ischemic attack (TIA), and cerebral infarction without residual deficits: Secondary | ICD-10-CM | POA: Diagnosis not present

## 2024-03-02 DIAGNOSIS — R251 Tremor, unspecified: Secondary | ICD-10-CM | POA: Insufficient documentation

## 2024-03-02 DIAGNOSIS — D329 Benign neoplasm of meninges, unspecified: Secondary | ICD-10-CM | POA: Diagnosis not present

## 2024-03-02 MED ORDER — GADOBUTROL 1 MMOL/ML IV SOLN
10.0000 mL | Freq: Once | INTRAVENOUS | Status: AC | PRN
  Administered 2024-03-02: 10 mL via INTRAVENOUS

## 2024-03-02 MED ORDER — LORAZEPAM 1 MG PO TABS
1.0000 mg | ORAL_TABLET | ORAL | 0 refills | Status: AC
  Filled 2024-03-02: qty 1, 1d supply, fill #0

## 2024-03-20 ENCOUNTER — Other Ambulatory Visit (HOSPITAL_COMMUNITY): Payer: Self-pay

## 2024-03-21 ENCOUNTER — Other Ambulatory Visit: Payer: Self-pay

## 2024-03-25 ENCOUNTER — Other Ambulatory Visit (HOSPITAL_COMMUNITY): Payer: Self-pay

## 2024-03-25 MED ORDER — OXYCODONE HCL 10 MG PO TABS
10.0000 mg | ORAL_TABLET | Freq: Four times a day (QID) | ORAL | 0 refills | Status: AC | PRN
  Filled 2024-03-25: qty 120, 30d supply, fill #0

## 2024-03-30 ENCOUNTER — Other Ambulatory Visit (HOSPITAL_COMMUNITY): Payer: Self-pay

## 2024-03-30 MED ORDER — MEDROXYPROGESTERONE ACETATE 10 MG PO TABS
10.0000 mg | ORAL_TABLET | Freq: Every day | ORAL | 0 refills | Status: AC
  Filled 2024-03-30: qty 90, 90d supply, fill #0

## 2024-03-31 ENCOUNTER — Other Ambulatory Visit (HOSPITAL_COMMUNITY): Payer: Self-pay

## 2024-07-08 ENCOUNTER — Ambulatory Visit: Admitting: Diagnostic Neuroimaging
